# Patient Record
Sex: Female | Born: 1955 | Race: White | Hispanic: No | Smoking: Current every day smoker
Health system: Southern US, Community
[De-identification: ages and names within clinical notes are randomized; demographics above are authoritative.]

## PROBLEM LIST (undated history)

## (undated) DIAGNOSIS — I10 Essential (primary) hypertension: Secondary | ICD-10-CM

---

## 2003-11-25 ENCOUNTER — Emergency Department: Payer: Self-pay | Admitting: Emergency Medicine

## 2005-01-07 ENCOUNTER — Ambulatory Visit: Payer: Self-pay | Admitting: Obstetrics and Gynecology

## 2006-01-10 ENCOUNTER — Ambulatory Visit: Payer: Self-pay | Admitting: Obstetrics and Gynecology

## 2007-01-18 ENCOUNTER — Ambulatory Visit: Payer: Self-pay | Admitting: Obstetrics and Gynecology

## 2008-03-28 ENCOUNTER — Ambulatory Visit: Payer: Self-pay | Admitting: Obstetrics and Gynecology

## 2010-02-24 ENCOUNTER — Emergency Department: Payer: Self-pay | Admitting: Emergency Medicine

## 2010-02-24 ENCOUNTER — Ambulatory Visit: Payer: Self-pay | Admitting: Internal Medicine

## 2010-03-19 ENCOUNTER — Ambulatory Visit: Payer: Self-pay | Admitting: Family Medicine

## 2010-03-30 ENCOUNTER — Ambulatory Visit: Payer: Self-pay | Admitting: Family Medicine

## 2011-03-22 ENCOUNTER — Ambulatory Visit: Payer: Self-pay | Admitting: Family Medicine

## 2011-03-29 ENCOUNTER — Ambulatory Visit: Payer: Self-pay | Admitting: Family Medicine

## 2012-04-23 ENCOUNTER — Ambulatory Visit: Payer: Self-pay | Admitting: Emergency Medicine

## 2013-10-14 ENCOUNTER — Ambulatory Visit: Payer: Self-pay | Admitting: Physician Assistant

## 2014-12-25 ENCOUNTER — Emergency Department
Admission: EM | Admit: 2014-12-25 | Discharge: 2014-12-26 | Disposition: A | Discharge status: Home / Self Care | Payer: BLUE CROSS/BLUE SHIELD | Attending: Emergency Medicine | Admitting: Emergency Medicine

## 2014-12-25 DIAGNOSIS — Z79899 Other long term (current) drug therapy: Secondary | ICD-10-CM | POA: Insufficient documentation

## 2014-12-25 DIAGNOSIS — R000 Tachycardia, unspecified: Secondary | ICD-10-CM | POA: Insufficient documentation

## 2014-12-25 DIAGNOSIS — F17200 Nicotine dependence, unspecified, uncomplicated: Secondary | ICD-10-CM | POA: Insufficient documentation

## 2014-12-25 DIAGNOSIS — R0602 Shortness of breath: Secondary | ICD-10-CM | POA: Diagnosis not present

## 2014-12-25 DIAGNOSIS — Z880 Allergy status to penicillin: Secondary | ICD-10-CM | POA: Diagnosis not present

## 2014-12-25 DIAGNOSIS — R079 Chest pain, unspecified: Secondary | ICD-10-CM | POA: Diagnosis present

## 2014-12-25 DIAGNOSIS — I10 Essential (primary) hypertension: Secondary | ICD-10-CM | POA: Diagnosis not present

## 2014-12-25 DIAGNOSIS — R0789 Other chest pain: Secondary | ICD-10-CM | POA: Insufficient documentation

## 2014-12-25 HISTORY — DX: Essential (primary) hypertension: I10

## 2014-12-26 ENCOUNTER — Emergency Department: Payer: BLUE CROSS/BLUE SHIELD

## 2014-12-26 ENCOUNTER — Encounter: Payer: Self-pay | Admitting: Emergency Medicine

## 2014-12-26 LAB — HEPATIC FUNCTION PANEL
ALT: 14 U/L (ref 14–54)
AST: 23 U/L (ref 15–41)
Albumin: 3.8 g/dL (ref 3.5–5.0)
Alkaline Phosphatase: 89 U/L (ref 38–126)
BILIRUBIN DIRECT: 0.1 mg/dL (ref 0.1–0.5)
BILIRUBIN TOTAL: 0.6 mg/dL (ref 0.3–1.2)
Indirect Bilirubin: 0.5 mg/dL (ref 0.3–0.9)
Total Protein: 6.4 g/dL — ABNORMAL LOW (ref 6.5–8.1)

## 2014-12-26 LAB — BASIC METABOLIC PANEL
ANION GAP: 4 — AB (ref 5–15)
BUN: 11 mg/dL (ref 6–20)
CALCIUM: 9.2 mg/dL (ref 8.9–10.3)
CO2: 25 mmol/L (ref 22–32)
CREATININE: 0.62 mg/dL (ref 0.44–1.00)
Chloride: 110 mmol/L (ref 101–111)
GFR calc Af Amer: >60 mL/min (ref >60)
GFR calc non Af Amer: >60 mL/min (ref >60)
GLUCOSE: 116 mg/dL — AB (ref 65–99)
Potassium: 4.1 mmol/L (ref 3.5–5.1)
Sodium: 139 mmol/L (ref 135–145)

## 2014-12-26 LAB — CBC
HEMATOCRIT: 44.7 % (ref 35.0–47.0)
Hemoglobin: 14.8 g/dL (ref 12.0–16.0)
MCH: 30.4 pg (ref 26.0–34.0)
MCHC: 33.2 g/dL (ref 32.0–36.0)
MCV: 91.6 fL (ref 80.0–100.0)
Platelets: 254 10*3/uL (ref 150–440)
RBC: 4.88 MIL/uL (ref 3.80–5.20)
RDW: 13 % (ref 11.5–14.5)
WBC: 12.5 10*3/uL — ABNORMAL HIGH (ref 3.6–11.0)

## 2014-12-26 LAB — TROPONIN I

## 2014-12-26 LAB — MAGNESIUM: Magnesium: 2 mg/dL (ref 1.7–2.4)

## 2014-12-26 MED ORDER — ASPIRIN 81 MG PO CHEW
324.0000 mg | CHEWABLE_TABLET | Freq: Once | ORAL | Status: AC
  Administered 2014-12-26: 324 mg via ORAL
  Filled 2014-12-26: qty 4

## 2014-12-26 NOTE — ED Provider Notes (Signed)
[facility] Emergency Department Provider Note  ____________________________________________  Time seen: Approximately 12:28 AM  I have reviewed the triage vital signs and the nursing notes.   HISTORY  Chief Complaint Chest Pain    HPI Holly Cordova is a 59 y.o. female with a history of hypertension and tobacco use who presents with acute onset chest pain since yesterday.  Reportedly the chest pain started yesterday but increased today.  It is severe at worst but is currently gone.  It radiates down her left arm.  During the time that she was having the severe chest pain earlier this evening she also felt like she was going to pass out and her daughter, who is a Engineer, civil (consulting), put a pulse ox monitor on her and states that her oxygen saturation dropped from 93% down to 75% while she was having the chest pain and then back up to 93%.  She describes the chest pain as below her sternum, severe stabbing, and going "right through me".  When EMS arrived she was hypertensive with a systolic pressure in the 190s but she was normotensive for [place] in the emergency department.  She is currently pain-free and looks and acts comfortable.  She has had no nausea/vomiting, abdominal pain, dysuria, fever/chills.   Past Medical History  Diagnosis Date  . Hypertension     There are no active problems to display for this patient.   Past Surgical History  Procedure Laterality Date  . Cesarean section      Current Outpatient Rx  Name  Route  Sig  Dispense  Refill  . lisinopril (PRINIVIL,ZESTRIL) 10 MG tablet   Oral   Take 10 mg by mouth daily.      3     Allergies Morphine and related and Penicillins  No family history on file.  Social History Social History  Substance Use Topics  . Smoking status: Current Every Day Smoker  . Smokeless tobacco: None  . Alcohol Use: No    Review of Systems Constitutional: No fever/chills Eyes: No visual changes. ENT: No sore  throat. Cardiovascular: Chest pain over the last 24+ hours.  Regional palpitations Respiratory: Shortness of breath with the severe chest pain. Gastrointestinal: No abdominal pain.  No nausea, no vomiting.  No diarrhea.  No constipation. Genitourinary: Negative for dysuria. Musculoskeletal: Negative for back pain. Skin: Negative for rash. Neurological: Negative for headaches, focal weakness or numbness.  Did have an episode where she thought she might pass out. 10-point ROS otherwise negative.  ____________________________________________   PHYSICAL EXAM:  VITAL SIGNS: ED Triage Vitals  Enc Vitals Group     BP 12/26/14 0003 138/96 mmHg     Pulse Rate 12/26/14 0003 101     Resp --      Temp 12/26/14 0003 98.1 F (36.7 C)     Temp Source 12/26/14 0003 Oral     SpO2 12/26/14 0003 100 %     Weight 12/26/14 0003 200 lb (90.719 kg)     Height --      Head Cir --      Peak Flow --      Pain Score --      Pain Loc --      Pain Edu? --      Excl. in GC? --     Constitutional: Alert and oriented. Well appearing and in no acute distress. Eyes: Conjunctivae are normal. PERRL. EOMI. Head: Atraumatic. Nose: No congestion/rhinnorhea. Mouth/Throat: Mucous membranes are moist.  Oropharynx non-erythematous.  Neck: No stridor.   Cardiovascular: Borderline tachycardia, regular rhythm. Grossly normal heart sounds.  Good peripheral circulation. Respiratory: Normal respiratory effort.  No retractions. Lungs CTAB. Gastrointestinal: Soft and nontender. No distention. No abdominal bruits. No CVA tenderness. Musculoskeletal: No lower extremity tenderness nor edema.  No joint effusions.  Reproducible pain/tenderness with range of motion of the left arm/shoulder. Neurologic:  Normal speech and language. No gross focal neurologic deficits are appreciated.  Skin:  Skin is warm, dry and intact. No rash noted. Remove comments  ____________________________________________   LABS (all labs ordered  are listed, but only abnormal results are displayed)  Labs Reviewed  CBC - Abnormal; Notable for the following:    WBC 12.5 (*)    All other components within normal limits  BASIC METABOLIC PANEL - Abnormal; Notable for the following:    Glucose, Bld 116 (*)    Anion gap 4 (*)    All other components within normal limits  HEPATIC FUNCTION PANEL - Abnormal; Notable for the following:    Total Protein 6.4 (*)    All other components within normal limits  MAGNESIUM  TROPONIN I   ____________________________________________  EKG  ED ECG REPORT I, Derward Marple, the attending physician, personally viewed and interpreted this ECG.  Date: 12/26/2014 EKG Time: 00:08 Rate: 97 Rhythm: normal sinus rhythm QRS Axis: normal Intervals: normal ST/T Wave abnormalities: normal Conduction Disutrbances: none Narrative Interpretation: unremarkable  ____________________________________________  RADIOLOGY   Dg Chest 2 View  12/26/2014  CLINICAL DATA:  Acute onset of worsening generalized chest pain, running down the left arm. Tachycardia and hypertension. Initial encounter. EXAM: CHEST  2 VIEW COMPARISON:  None. FINDINGS: The lungs are well-aerated. Mild peribronchial thickening is noted. There is no evidence of focal opacification, pleural effusion or pneumothorax. The heart is normal in size; the mediastinal contour is within normal limits. No acute osseous abnormalities are seen. IMPRESSION: Mild peribronchial thickening noted.  Lungs otherwise clear. Electronically Signed   By: Roanna RaiderJeffery  Chang M.D.   On: 12/26/2014 00:34    ____________________________________________   PROCEDURES  Procedure(s) performed: None  Critical Care performed: No ____________________________________________   INITIAL IMPRESSION / ASSESSMENT AND PLAN / ED COURSE  Pertinent labs & imaging results that were available during my care of the patient were reviewed by me and considered in my medical decision  making (see chart for details).  The patient's HEART score is 3 (low risk, appropriate for immediate discharge).  Wells Score for PE is at most 1.5 if you take the tachycardia greater than 100 bpm, although this is not consistent and she is typically right around 100.  This also makes pulmonary embolism unlikely.  The patient was observed for multiple hours in the emergency department and had no recurrence of any of her symptoms.  I discussed the possibility that she may be suffering from panic attacks.  She is going through a difficult time due to an issue at work that is resulting in a [employer] court case for which she has to be a witness in several days.  Her workup is reassuring today and I discussed with her the need for outpatient follow-up.  She was given a full dose aspirin when she was in the emergency department.  She is asymptomatic and in good spirits at the time of our discussion and I gave her my usual and customary return precautions.  ____________________________________________  FINAL CLINICAL IMPRESSION(S) / ED DIAGNOSES  Final diagnoses:  Atypical chest pain      NEW  MEDICATIONS STARTED DURING THIS VISIT:  Discharge Medication List as of 12/26/2014  4:38 AM       Loleta Rose, MD 12/26/14 [phone]

## 2014-12-26 NOTE — ED Notes (Signed)
Report received care assumed.
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
## 2014-12-26 NOTE — Discharge Instructions (Signed)

## 2014-12-26 NOTE — ED Notes (Signed)
Lab called at 2:52 and notified RN that light green tube was not useable. Lab was called back and notified that there was a red top tube down there and to use it. They stated that they would look for it and use it.

## 2014-12-26 NOTE — ED Notes (Signed)
Discharge instructions reviewed.  Pt going home with daughter driving.  Verbalized understanding of discharge instruction.  No questions or concerns at this time.  Items with pt upon discharge.  No items left in ED.
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
## 2014-12-31 ENCOUNTER — Ambulatory Visit
Admission: EM | Admit: 2014-12-31 | Discharge: 2014-12-31 | Disposition: A | Discharge status: Home / Self Care | Payer: BLUE CROSS/BLUE SHIELD | Attending: Family Medicine | Admitting: Family Medicine

## 2014-12-31 ENCOUNTER — Encounter: Payer: Self-pay | Admitting: Emergency Medicine

## 2014-12-31 DIAGNOSIS — F419 Anxiety disorder, unspecified: Secondary | ICD-10-CM | POA: Insufficient documentation

## 2014-12-31 DIAGNOSIS — Z880 Allergy status to penicillin: Secondary | ICD-10-CM | POA: Diagnosis not present

## 2014-12-31 DIAGNOSIS — R000 Tachycardia, unspecified: Secondary | ICD-10-CM | POA: Diagnosis not present

## 2014-12-31 DIAGNOSIS — Z79899 Other long term (current) drug therapy: Secondary | ICD-10-CM | POA: Insufficient documentation

## 2014-12-31 DIAGNOSIS — F17200 Nicotine dependence, unspecified, uncomplicated: Secondary | ICD-10-CM | POA: Diagnosis not present

## 2014-12-31 DIAGNOSIS — I10 Essential (primary) hypertension: Secondary | ICD-10-CM | POA: Diagnosis not present

## 2014-12-31 LAB — URINALYSIS COMPLETE WITH MICROSCOPIC (ARMC ONLY)
BILIRUBIN URINE: NEGATIVE
Glucose, UA: NEGATIVE mg/dL
Hgb urine dipstick: NEGATIVE
KETONES UR: NEGATIVE mg/dL
Nitrite: NEGATIVE
Protein, ur: NEGATIVE mg/dL
RBC / HPF: NONE SEEN RBC/hpf (ref 0–5)
SPECIFIC GRAVITY, URINE: 1.01 (ref 1.005–1.030)
pH: 6 (ref 5.0–8.0)

## 2014-12-31 LAB — BASIC METABOLIC PANEL
Anion gap: 11 (ref 5–15)
BUN: 12 mg/dL (ref 6–20)
CO2: 26 mmol/L (ref 22–32)
CREATININE: 0.82 mg/dL (ref 0.44–1.00)
Calcium: 10.3 mg/dL (ref 8.9–10.3)
Chloride: 103 mmol/L (ref 101–111)
GFR calc Af Amer: >60 mL/min (ref >60)
GLUCOSE: 144 mg/dL — AB (ref 65–99)
POTASSIUM: 4 mmol/L (ref 3.5–5.1)
SODIUM: 140 mmol/L (ref 135–145)

## 2014-12-31 LAB — CBC WITH DIFFERENTIAL/PLATELET
BASOS ABS: 0.1 10*3/uL (ref 0–0.1)
Basophils Relative: 1 %
EOS ABS: 0.2 10*3/uL (ref 0–0.7)
EOS PCT: 2 %
HCT: 47 % (ref 35.0–47.0)
Hemoglobin: 15.9 g/dL (ref 12.0–16.0)
LYMPHS ABS: 3.9 10*3/uL — AB (ref 1.0–3.6)
LYMPHS PCT: 34 %
MCH: 30.9 pg (ref 26.0–34.0)
MCHC: 33.8 g/dL (ref 32.0–36.0)
MCV: 91.4 fL (ref 80.0–100.0)
MONO ABS: 0.5 10*3/uL (ref 0.2–0.9)
Monocytes Relative: 5 %
Neutro Abs: 6.6 10*3/uL — ABNORMAL HIGH (ref 1.4–6.5)
Neutrophils Relative %: 58 %
PLATELETS: 243 10*3/uL (ref 150–440)
RBC: 5.14 MIL/uL (ref 3.80–5.20)
RDW: 12.9 % (ref 11.5–14.5)
WBC: 11.3 10*3/uL — AB (ref 3.6–11.0)

## 2014-12-31 LAB — MAGNESIUM: Magnesium: 2.1 mg/dL (ref 1.7–2.4)

## 2014-12-31 NOTE — ED Provider Notes (Signed)
CSN: [id number]     Arrival date & time 12/31/14  1934 History   None    Chief Complaint  Patient presents with  . Tachycardia   (Consider location/radiation/quality/duration/timing/severity/associated sxs/prior Treatment) HPI Comments: 59 yo female with a c/o "feeling like heart is racing" and high blood pressure at home. Patient brought in by daughter. States has had similar symptoms in the past, last time being last week and was seen in ED with negative work up. Denies any situational anxiety, fevers, chills, chest pains, shortness of breath, fainting, syncopal episodes; leg swelling; however states felt slightly dizzy earlier.   The history is provided by the patient.    Past Medical History  Diagnosis Date  . Hypertension    Past Surgical History  Procedure Laterality Date  . Cesarean section     History reviewed. No pertinent family history. Social History  Substance Use Topics  . Smoking status: Current Every Day Smoker  . Smokeless tobacco: None  . Alcohol Use: No   OB History    No data available     Review of Systems  Allergies  Morphine and related and Penicillins  Home Medications   Prior to Admission medications   Medication Sig Start Date End Date Taking? Authorizing Provider  ALPRAZolam Prudy Feeler(XANAX) 0.25 MG tablet Take 0.25 mg by mouth at bedtime as needed for anxiety.   Yes Historical Provider, MD  lisinopril (PRINIVIL,ZESTRIL) 10 MG tablet Take 10 mg by mouth daily. 12/12/14  Yes Historical Provider, MD   Meds Ordered and Administered this Visit  Medications - No data to display  BP 142/67 mmHg  Pulse 103  Temp(Src) 98.3 F (36.8 C)  Resp 18  Ht 5\' 1"  (1.549 m)  Wt 199 lb (90.266 kg)  BMI 37.62 kg/m2  SpO2 100% No data found.   Physical Exam  Constitutional: She is oriented to person, place, and time. She appears well-developed and well-nourished. No distress.  HENT:  Head: Normocephalic.  Right Ear: Tympanic membrane, external ear and ear  canal normal.  Left Ear: Tympanic membrane, external ear and ear canal normal.  Nose: Nose normal.  Mouth/Throat: Oropharynx is clear and moist and mucous membranes are normal.  Eyes: Conjunctivae and EOM are normal. Pupils are equal, round, and reactive to light. Right eye exhibits no discharge. Left eye exhibits no discharge. No scleral icterus.  Neck: Normal range of motion. Neck supple. No JVD present. No tracheal deviation present. No thyromegaly present.  Cardiovascular: Regular rhythm, normal heart sounds and intact distal pulses.  Tachycardia present.   No murmur heard. Pulmonary/Chest: Effort normal and breath sounds normal. No stridor. No respiratory distress. She has no wheezes. She has no rales. She exhibits no tenderness.  Musculoskeletal: She exhibits no edema or tenderness.  Lymphadenopathy:    She has no cervical adenopathy.  Neurological: She is alert and oriented to person, place, and time.  Skin: No rash noted. She is not diaphoretic.  Nursing note and vitals reviewed.   ED Course  Procedures (including critical care time)  Labs Review Labs Reviewed  BASIC METABOLIC PANEL - Abnormal; Notable for the following:    Glucose, Bld 144 (*)    All other components within normal limits  CBC WITH DIFFERENTIAL/PLATELET - Abnormal; Notable for the following:    WBC 11.3 (*)    Neutro Abs 6.6 (*)    Lymphs Abs 3.9 (*)    All other components within normal limits  URINALYSIS COMPLETEWITH MICROSCOPIC ([facility] ONLY) - Abnormal; Notable for  the following:    Leukocytes, UA TRACE (*)    Bacteria, UA RARE (*)    Squamous Epithelial / LPF 0-5 (*)    All other components within normal limits  TSH - Abnormal; Notable for the following:    TSH 7.217 (*)    All other components within normal limits  MAGNESIUM    Imaging Review No results found.   Visual Acuity Review  Right Eye Distance:   Left Eye Distance:   Bilateral Distance:    Right Eye Near:   Left Eye Near:     Bilateral Near:      EKG: sinus tachycardia; reviewed by me; agree with readout  MDM   1. Tachycardia   2. Anxiety    Discharge Medication List as of 12/31/2014  9:17 PM    1. Labresults and diagnosis reviewed with patient/daughter 2.patient given ativan  po x1 with improvement of symptoms and signs; bp and pulse decreased 3. TSH pending 4. Follow-up with PCP this week for further evaluation, possible referral to cardiology for further assessment; or prn if symptoms worsen or don't improve    Payton Mccallum, MD 01/09/15 1117

## 2014-12-31 NOTE — ED Notes (Signed)
Patient states she feels like her heart is racing and her bp is elevated. Was seen in the ED last Wednesday evening for similar symptoms.

## 2015-01-01 LAB — TSH: TSH: 7.217 u[IU]/mL — ABNORMAL HIGH (ref 0.350–4.500)

## 2015-01-13 ENCOUNTER — Emergency Department
Admission: EM | Admit: 2015-01-13 | Discharge: 2015-01-14 | Disposition: A | Discharge status: Home / Self Care | Payer: BLUE CROSS/BLUE SHIELD | Attending: Emergency Medicine | Admitting: Emergency Medicine

## 2015-01-13 DIAGNOSIS — R002 Palpitations: Secondary | ICD-10-CM | POA: Diagnosis present

## 2015-01-13 DIAGNOSIS — R200 Anesthesia of skin: Secondary | ICD-10-CM | POA: Diagnosis not present

## 2015-01-13 DIAGNOSIS — Z79899 Other long term (current) drug therapy: Secondary | ICD-10-CM | POA: Diagnosis not present

## 2015-01-13 DIAGNOSIS — I10 Essential (primary) hypertension: Secondary | ICD-10-CM | POA: Diagnosis not present

## 2015-01-13 DIAGNOSIS — E039 Hypothyroidism, unspecified: Secondary | ICD-10-CM

## 2015-01-13 DIAGNOSIS — Z880 Allergy status to penicillin: Secondary | ICD-10-CM | POA: Diagnosis not present

## 2015-01-13 DIAGNOSIS — R202 Paresthesia of skin: Secondary | ICD-10-CM | POA: Diagnosis not present

## 2015-01-13 DIAGNOSIS — F17200 Nicotine dependence, unspecified, uncomplicated: Secondary | ICD-10-CM | POA: Diagnosis not present

## 2015-01-13 LAB — CBC
HEMATOCRIT: 47.8 % — AB (ref 35.0–47.0)
HEMOGLOBIN: 15.6 g/dL (ref 12.0–16.0)
MCH: 30.4 pg (ref 26.0–34.0)
MCHC: 32.7 g/dL (ref 32.0–36.0)
MCV: 92.8 fL (ref 80.0–100.0)
Platelets: 282 10*3/uL (ref 150–440)
RBC: 5.15 MIL/uL (ref 3.80–5.20)
RDW: 13.3 % (ref 11.5–14.5)
WBC: 13.7 10*3/uL — ABNORMAL HIGH (ref 3.6–11.0)

## 2015-01-13 LAB — BASIC METABOLIC PANEL
Anion gap: 8 (ref 5–15)
BUN: 15 mg/dL (ref 6–20)
CHLORIDE: 106 mmol/L (ref 101–111)
CO2: 26 mmol/L (ref 22–32)
CREATININE: 0.77 mg/dL (ref 0.44–1.00)
Calcium: 10.1 mg/dL (ref 8.9–10.3)
GFR calc non Af Amer: >60 mL/min (ref >60)
Glucose, Bld: 143 mg/dL — ABNORMAL HIGH (ref 65–99)
POTASSIUM: 3.6 mmol/L (ref 3.5–5.1)
Sodium: 140 mmol/L (ref 135–145)

## 2015-01-13 LAB — T4, FREE: FREE T4: 0.85 ng/dL (ref 0.61–1.12)

## 2015-01-13 LAB — TSH: TSH: 6.356 u[IU]/mL — AB (ref 0.350–4.500)

## 2015-01-14 LAB — TROPONIN I: Troponin I: <0.03 ng/mL (ref <0.031)

## 2015-01-14 NOTE — ED Provider Notes (Signed)
[facility] Emergency Department Provider Note  ____________________________________________  Time seen: 12:15 AM  I have reviewed the triage vital signs and the nursing notes.   HISTORY  Chief Complaint Numbness      HPI Holly Cordova is a 59 y.o. female presents with history of "heart racing" this evening following by bilateral hands and feet tingling and numbness and dizziness. Patient denies any chest pain no shortness of breath no nausea or vomiting. Patient states that she's had 2 previous episodes of the same.  Past Medical History  Diagnosis Date  . Hypertension     There are no active problems to display for this patient.   Past Surgical History  Procedure Laterality Date  . Cesarean section      Current Outpatient Rx  Name  Route  Sig  Dispense  Refill  . FLUoxetine (PROZAC) 20 MG capsule   Oral   Take 20 mg by mouth daily.         Marland Kitchen lisinopril (PRINIVIL,ZESTRIL) 10 MG tablet   Oral   Take 10 mg by mouth daily.      3   . LORazepam (ATIVAN) 0.5 MG tablet   Oral   Take 0.5 mg by mouth 2 (two) times daily as needed for anxiety.         . ALPRAZolam (XANAX) 0.25 MG tablet   Oral   Take 0.25 mg by mouth at bedtime as needed for anxiety.           Allergies Morphine and related and Penicillins  No family history on file.  Social History Social History  Substance Use Topics  . Smoking status: Current Every Day Smoker  . Smokeless tobacco: None  . Alcohol Use: No    Review of Systems  Constitutional: Negative for fever. Eyes: Negative for visual changes. ENT: Negative for sore throat. Cardiovascular: Negative for chest pain. Positive palpitations Respiratory: Negative for shortness of breath. Gastrointestinal: Negative for abdominal pain, vomiting and diarrhea. Genitourinary: Negative for dysuria. Musculoskeletal: Negative for back pain. Skin: Negative for rash. Neurological: Negative for headaches,  focal weakness or numbness.   10-point ROS otherwise negative.  ____________________________________________   PHYSICAL EXAM:  VITAL SIGNS: ED Triage Vitals  Enc Vitals Group     BP 01/13/15 2109 167/73 mmHg     Pulse Rate 01/13/15 2109 120     Resp 01/13/15 2109 18     Temp 01/13/15 2109 98.8 F (37.1 C)     Temp Source 01/13/15 2109 Oral     SpO2 01/13/15 2109 99 %     Weight 01/13/15 2109 195 lb (88.451 kg)     Height 01/13/15 2109  (1.549 m)     Head Cir --      Peak Flow --      Pain Score 01/13/15 2112 0     Pain Loc --      Pain Edu? --      Excl. in GC? --     Constitutional: Alert and oriented. Well appearing and in no distress. Eyes: Conjunctivae are normal. PERRL. Normal extraocular movements. ENT   Head: Normocephalic and atraumatic.   Nose: No congestion/rhinnorhea.   Mouth/Throat: Mucous membranes are moist.   Neck: No stridor. Hematological/Lymphatic/Immunilogical: No cervical lymphadenopathy. Cardiovascular: Normal rate, regular rhythm. Normal and symmetric distal pulses are present in all extremities. No murmurs, rubs, or gallops. Respiratory: Normal respiratory effort without tachypnea nor retractions. Breath sounds are clear and equal bilaterally. No wheezes/rales/rhonchi. Gastrointestinal:  Soft and nontender. No distention. There is no CVA tenderness. Genitourinary: deferred Musculoskeletal: Nontender with normal range of motion in all extremities. No joint effusions.  No lower extremity tenderness nor edema. Neurologic:  Normal speech and language. No gross focal neurologic deficits are appreciated. Speech is normal.  Skin:  Skin is warm, dry and intact. No rash noted. Psychiatric: Mood and affect are normal. Speech and behavior are normal. Patient exhibits appropriate insight and judgment.  ____________________________________________    LABS (pertinent positives/negatives)  Labs Reviewed  TSH - Abnormal; Notable for the  following:    TSH 6.356 (*)    All other components within normal limits  CBC - Abnormal; Notable for the following:    WBC 13.7 (*)    HCT 47.8 (*)    All other components within normal limits  BASIC METABOLIC PANEL - Abnormal; Notable for the following:    Glucose, Bld 143 (*)    All other components within normal limits  T4, FREE     ____________________________________________   EKG  ED ECG REPORT I, Carmelia Tiner, [place] N, the attending physician, personally viewed and interpreted this ECG.   Date: 01/14/2015  EKG Time: 9:31 PM  Rate: 105  Rhythm: Sinus tachycardia  Axis: None  Intervals: Normal  ST&T Change: None   ____________________________________________    RADIOLOGY     INITIAL IMPRESSION / ASSESSMENT AND PLAN / ED COURSE  Pertinent labs & imaging results that were available during my care of the patient were reviewed by me and considered in my medical decision making (see chart for details).  Patient very calm and does not appear to be anxious during emergency Department stay. Concern for possible arrhythmia as inciting event which may have provoked some anxiety. As such we'll refer the patient is Dr. Gwen PoundsKowalski for further evaluation and possible arrhythmia. I suspect the patient's tingling and numbness in her hands and feet was probably secondary to tachypnea with resultant hypocarbia.  ____________________________________________   FINAL CLINICAL IMPRESSION(S) / ED DIAGNOSES  Final diagnoses:  Heart palpitations  Hypothyroidism, unspecified hypothyroidism type      Darci Currentandolph N Audreena Sachdeva, MD 01/14/15 [phone]

## 2015-01-14 NOTE — ED Notes (Signed)
Patient with no complaints at this time. Respirations even and unlabored. Skin warm/dry. Discharge instructions reviewed with patient at this time. Patient given opportunity to voice concerns/ask questions. Patient discharged at this time and left Emergency Department with steady gait.   

## 2015-01-14 NOTE — ED Notes (Signed)
RN called lab to report the added troponin.

## 2015-01-14 NOTE — ED Notes (Signed)
Pt seen 2 weeks ago in ED for elevated HR and HTN. Pt dx with anxiety and given ativan and prozac. Pt reports no improvements in symptoms since beginning medication. Pt reports sitting on couch tonight and a "wave" came over her and her heart begins racing she becomes dizzy and tingling in both hands and feet. Pt denies SOB during attack but does report becoming flushed and feeling as though she would pass out.

## 2015-01-14 NOTE — ED Notes (Signed)
Lab called in regards to troponin. Joe reports the test should be done by now but then transfers RN to another lab member who states the test is an add on and "I will grab the tube and run it now."

## 2015-01-14 NOTE — ED Notes (Signed)
MD at bedside. 

## 2015-01-14 NOTE — Discharge Instructions (Signed)

## 2015-02-18 ENCOUNTER — Ambulatory Visit
Admission: EM | Admit: 2015-02-18 | Discharge: 2015-02-18 | Disposition: A | Discharge status: Home / Self Care | Payer: BLUE CROSS/BLUE SHIELD | Attending: Family Medicine | Admitting: Family Medicine

## 2015-02-18 DIAGNOSIS — G44209 Tension-type headache, unspecified, not intractable: Secondary | ICD-10-CM | POA: Diagnosis not present

## 2015-02-18 MED ORDER — KETOROLAC TROMETHAMINE 60 MG/2ML IM SOLN
60.0000 mg | Freq: Once | INTRAMUSCULAR | Status: AC
  Administered 2015-02-18: 60 mg via INTRAMUSCULAR

## 2015-02-18 MED ORDER — NAPROXEN 375 MG PO TABS
375.0000 mg | ORAL_TABLET | Freq: Two times a day (BID) | ORAL | Status: DC
Start: 2015-02-18 — End: 2018-02-22

## 2015-02-18 NOTE — ED Provider Notes (Signed)
CSN: [id number]     Arrival date & time 02/18/15  1846 History   First MD Initiated Contact with Patient 02/18/15 1944     Chief Complaint  Patient presents with  . Headache   (Consider location/radiation/quality/duration/timing/severity/associated sxs/prior Treatment) HPI  This a 60 year old female who presents with headache that she awoke with his had all day. She's tried ibuprofen without relief. He has had some nausea vomiting and a red hot face which she usually encounters when her blood pressure is elevated. She's had no visual disturbances no vomiting no confusion no motor weakness no numbness or tingling. She felt that her blood pressure was elevated so she took an extra half a tab in the afternoon which is unusual. Presentation she has a febrile she has a BP 140/58 , respirations 16 blood pressure 140/58 and O2 sats of 100% on room air. Pulse was not recorded but will be taken. She states that this is not the worst headache she has ever had and just a nagging headache. She's had frequent headaches in the past. Recently undergone a seat MRI upper head and neck last month which was negative. She had extensive workup the cause of palpitations nothing was definitively found and they relayed that it was most likely due to anxiety.  Past Medical History  Diagnosis Date  . Hypertension    Past Surgical History  Procedure Laterality Date  . Cesarean section     No family history on file. Social History  Substance Use Topics  . Smoking status: Current Every Day Smoker -- 0.50 packs/day  . Smokeless tobacco: None  . Alcohol Use: No   OB History    No data available     Review of Systems  Constitutional: Negative for fever, chills, diaphoresis and fatigue.  HENT: Negative for congestion and ear pain.   Neurological: Positive for headaches. Negative for dizziness, tremors, facial asymmetry, speech difficulty, weakness, light-headedness and numbness.  Psychiatric/Behavioral: Negative  for confusion and agitation.  All other systems reviewed and are negative.   Allergies  Morphine and related and Penicillins  Home Medications   Prior to Admission medications   Medication Sig Start Date End Date Taking? Authorizing Provider  FLUoxetine (PROZAC) 20 MG capsule Take 20 mg by mouth daily.   Yes Historical Provider, MD  hydrOXYzine (ATARAX/VISTARIL) 25 MG tablet Take 25 mg by mouth 3 (three) times daily as needed.   Yes Historical Provider, MD  lisinopril (PRINIVIL,ZESTRIL) 10 MG tablet Take 10 mg by mouth daily. 12/12/14  Yes Historical Provider, MD  LORazepam (ATIVAN) 0.5 MG tablet Take 0.5 mg by mouth 2 (two) times daily as needed for anxiety.   Yes Historical Provider, MD  ALPRAZolam Prudy Feeler(XANAX) 0.25 MG tablet Take 0.25 mg by mouth at bedtime as needed for anxiety.    Historical Provider, MD  naproxen (NAPROSYN) 375 MG tablet Take 1 tablet (375 mg total) by mouth 2 (two) times daily. 02/18/15   Lutricia FeilWilliam P Kiegan Macaraeg, PA-C   Meds Ordered and Administered this Visit   Medications  ketorolac (TORADOL) injection 60 mg (60 mg Intramuscular Given 02/18/15 2000)    BP 148/58 mmHg  Pulse 77  Temp(Src) 98 F (36.7 C) (Oral)  Resp 16  Ht 5\' 1"  (1.549 m)  Wt 192 lb (87.091 kg)  BMI 36.30 kg/m2  SpO2 100% No data found.   Physical Exam  Constitutional: She is oriented to person, place, and time. She appears well-developed and well-nourished. No distress.  HENT:  Head: Normocephalic and atraumatic.  Right Ear: External ear normal.  Left Ear: External ear normal.  Nose: Nose normal.  Mouth/Throat: Oropharynx is clear and moist.  Eyes: Conjunctivae and EOM are normal. Pupils are equal, round, and reactive to light. Right eye exhibits no discharge. Left eye exhibits no discharge.  Neck: Normal range of motion. Neck supple.  Pulmonary/Chest: Breath sounds normal. No respiratory distress. She has no wheezes. She has no rales.  Musculoskeletal: Normal range of motion. She exhibits no  edema or tenderness.  Neurological: She is alert and oriented to person, place, and time. She displays normal reflexes. No cranial nerve deficit. She exhibits normal muscle tone. Coordination normal.  Skin: Skin is warm and dry. She is not diaphoretic.  Psychiatric: She has a normal mood and affect. Her behavior is normal. Judgment and thought content normal.  Nursing note and vitals reviewed.   ED Course  Procedures (including critical care time)  Labs Review Labs Reviewed - No data to display  Imaging Review No results found.   Visual Acuity Review  Right Eye Distance:   Left Eye Distance:   Bilateral Distance:    Right Eye Near:   Left Eye Near:    Bilateral Near:     Medications  ketorolac (TORADOL) injection 60 mg (60 mg Intramuscular Given 02/18/15 2000)      MDM   1. Tension-type headache, not intractable, unspecified chronicity pattern    Discharge Medication List as of 02/18/2015  8:43 PM    START taking these medications   Details  naproxen (NAPROSYN) 375 MG tablet Take 1 tablet (375 mg total) by mouth 2 (two) times daily., Starting 02/18/2015, Until Discontinued, Print      Plan: 1. Diagnosis reviewed with patient 2. rx as per orders; risks, benefits, potential side effects reviewed with patient 3. Recommend supportive treatment with rest and hydration. She can take her antianxiety medications as necessary. I've given her a prescription for Naprosyn that she can take when she has severe headaches and not to mix it with her regular ibuprofen. Reassured her that her blood pressure is not excessively elevated today. She needs to follow-up with her primary care physician. Her pulse rate was taken by the nurse prior to the patient's departure. Is recorded at 77 bpm. At the time of her discharge her pain did decrease to a 3 with a marked improvement after the Toradol injection 4. F/u prn if symptoms worsen or don't improve     Lutricia Feil, PA-C 02/18/15  2112

## 2015-02-18 NOTE — Discharge Instructions (Signed)
Tension Headache A tension headache is a feeling of pain, pressure, or aching that is often felt over the front and sides of the head. The pain can be dull, or it can feel tight (constricting). Tension headaches are not normally associated with nausea or vomiting, and they do not get worse with physical activity. Tension headaches can last from 30 minutes to several days. This is the most common type of headache. CAUSES The exact cause of this condition is not known. Tension headaches often begin after stress, anxiety, or depression. Other triggers may include:  Alcohol.  Too much caffeine, or caffeine withdrawal.  Respiratory infections, such as colds, flu, or sinus infections.  Dental problems or teeth clenching.  Fatigue.  Holding your head and neck in the same position for a long period of time, such as while using a computer.  Smoking. SYMPTOMS Symptoms of this condition include:  A feeling of pressure around the head.  Dull, aching head pain.  Pain felt over the front and sides of the head.  Tenderness in the muscles of the head, neck, and shoulders. DIAGNOSIS This condition may be diagnosed based on your symptoms and a physical exam. Tests may be done, such as a CT scan or an MRI of your head. These tests may be done if your symptoms are severe or unusual. TREATMENT This condition may be treated with lifestyle changes and medicines to help relieve symptoms. HOME CARE INSTRUCTIONS Managing Pain  Take over-the-counter and prescription medicines only as told by your health care provider.  Lie down in a dark, quiet room when you have a headache.  If directed, apply ice to the head and neck area:  Put ice in a plastic bag.  Place a towel between your skin and the bag.  Leave the ice on for 20 minutes, 2-3 times per day.  Use a heating pad or a hot shower to apply heat to the head and neck area as told by your health care provider. Eating and Drinking  Eat meals on  a regular schedule.  Limit alcohol use.  Decrease your caffeine intake, or stop using caffeine. General Instructions  Keep all follow-up visits as told by your health care provider. This is important.  Keep a headache journal to help find out what may trigger your headaches. For example, write down:  What you eat and drink.  How much sleep you get.  Any change to your diet or medicines.  Try massage or other relaxation techniques.  Limit stress.  Sit up straight, and avoid tensing your muscles.  Do not use tobacco products, including cigarettes, chewing tobacco, or e-cigarettes. If you need help quitting, ask your health care provider.  Exercise regularly as told by your health care provider.  Get 7-9 hours of sleep, or the amount recommended by your health care provider. SEEK MEDICAL CARE IF:  Your symptoms are not helped by medicine.  You have a headache that is different from what you normally experience.  You have nausea or you vomit.  You have a fever. SEEK IMMEDIATE MEDICAL CARE IF:  Your headache becomes severe.  You have repeated vomiting.  You have a stiff neck.  You have a loss of vision.  You have problems with speech.  You have pain in your eye or ear.  You have muscular weakness or loss of muscle control.  You lose your balance or you have trouble walking.  You feel faint or you pass out.  You have confusion.     This information is not intended to replace advice given to you by your health care provider. Make sure you discuss any questions you have with your health care provider.   Document Released: 01/25/2005 Document Revised: 10/16/2014 Document Reviewed: 05/20/2014 Elsevier Interactive Patient Education 2016 [employer]  

## 2015-02-18 NOTE — ED Notes (Signed)
Pt c/o H/A "all day". Pt has tried ibuprofen, without relief. Associated sx include nausea, "red face". Denies "floaters", blurred vision, confusion, vomiting. Pt took Lisinopril 10 mg at 0900, and 1/2 tab around 1300.

## 2015-12-18 ENCOUNTER — Ambulatory Visit
Admission: EM | Admit: 2015-12-18 | Discharge: 2015-12-18 | Disposition: A | Discharge status: Home / Self Care | Payer: BLUE CROSS/BLUE SHIELD | Attending: Emergency Medicine | Admitting: Emergency Medicine

## 2015-12-18 ENCOUNTER — Encounter: Payer: Self-pay | Admitting: *Deleted

## 2015-12-18 DIAGNOSIS — R300 Dysuria: Secondary | ICD-10-CM | POA: Diagnosis not present

## 2015-12-18 DIAGNOSIS — R109 Unspecified abdominal pain: Secondary | ICD-10-CM | POA: Diagnosis not present

## 2015-12-18 LAB — BASIC METABOLIC PANEL
ANION GAP: 8 (ref 5–15)
BUN: 18 mg/dL (ref 6–20)
CALCIUM: 9.1 mg/dL (ref 8.9–10.3)
CO2: 22 mmol/L (ref 22–32)
Chloride: 106 mmol/L (ref 101–111)
Creatinine, Ser: 0.96 mg/dL (ref 0.44–1.00)
GFR calc Af Amer: >60 mL/min (ref >60)
GLUCOSE: 120 mg/dL — AB (ref 65–99)
Potassium: 3.8 mmol/L (ref 3.5–5.1)
Sodium: 136 mmol/L (ref 135–145)

## 2015-12-18 LAB — URINALYSIS COMPLETE WITH MICROSCOPIC (ARMC ONLY)
GLUCOSE, UA: NEGATIVE mg/dL
Leukocytes, UA: NEGATIVE
NITRITE: NEGATIVE
Protein, ur: 30 mg/dL — AB
pH: 5 (ref 5.0–8.0)

## 2015-12-18 LAB — CBC WITH DIFFERENTIAL/PLATELET
Basophils Absolute: 0 10*3/uL (ref 0–0.1)
Basophils Relative: 0 %
EOS PCT: 3 %
Eosinophils Absolute: 0.3 10*3/uL (ref 0–0.7)
HEMATOCRIT: 41.6 % (ref 35.0–47.0)
Hemoglobin: 14 g/dL (ref 12.0–16.0)
LYMPHS ABS: 4.3 10*3/uL — AB (ref 1.0–3.6)
LYMPHS PCT: 48 %
MCH: 28.5 pg (ref 26.0–34.0)
MCHC: 33.6 g/dL (ref 32.0–36.0)
MCV: 84.9 fL (ref 80.0–100.0)
MONO ABS: 0.7 10*3/uL (ref 0.2–0.9)
MONOS PCT: 8 %
NEUTROS ABS: 3.7 10*3/uL (ref 1.4–6.5)
Neutrophils Relative %: 41 %
PLATELETS: 243 10*3/uL (ref 150–440)
RBC: 4.9 MIL/uL (ref 3.80–5.20)
RDW: 15.2 % — AB (ref 11.5–14.5)
WBC: 9 10*3/uL (ref 3.6–11.0)

## 2015-12-18 MED ORDER — CEPHALEXIN 500 MG PO CAPS: 500.0000 mg | ORAL_CAPSULE | Freq: Three times a day (TID) | ORAL | 0 refills | Status: AC

## 2015-12-18 NOTE — ED Triage Notes (Signed)
Onset of right flank pain, and urinary urgency today. Hx of UTIs. Denies other symptoms.

## 2015-12-18 NOTE — Discharge Instructions (Signed)
Take medication as prescribed. Rest. Drink plenty of fluids.  ° °Follow up with your primary care physician this week. Return to Urgent care for new or worsening concerns.  ° °

## 2015-12-18 NOTE — ED Provider Notes (Signed)
[facility] ____________________________________________  Time seen: Approximately 6:16 PM  I have reviewed the triage vital signs and the nursing notes.   HISTORY  Chief Complaint Flank Pain and Urinary Urgency  HPI Holly BonineDonna West Balles is a 60 y.o. female presenting for the complaints of 1 day history of urinary frequency, urinary urgency and right flank pain. Patient describes right flank pain as aching and sometimes sharp. Patient reports movement and twisting makes right flank pain worse. Denies any abdominal pain. Patient reports that she has had similar presentation with urinary tract infection. Patient reports the one urinary tract infection that she had previously and that have the same presentation [facility] was [facility] found that she had strep in her urine and then had to be changed from Cipro to cephalexin and tolerated well. Patient reports right flank pain at this time is mild to moderate. Patient states that [facility] performed, no report or history of kidney stones for patient.  Denies fevers. Denies nausea, vomiting, diarrhea, constipation, chest pain, shortness of breath. Denies known trigger for her current symptoms. Denies recent sickness or recent antibiotic use. Patient states she has not had a urinary tract infection a [facility]. Denies any recent medication changes.   Past Medical History:  Diagnosis Date  . Hypertension     There are no active problems to display for this patient.   Past Surgical History:  Procedure Laterality Date  . CESAREAN SECTION      Current Outpatient Rx  . Order #: [id number] Class: Historical Med  . Order #: [id number] Class: Historical Med  . Order #: [id number] Class: Historical Med  . Order #: [id number] Class: Normal  . Order #: [id number] Class: Historical Med  . Order #: [id number] Class: Historical Med  . Order #: [id number] Class: Print    No current facility-administered medications  for this encounter.   Current Outpatient Prescriptions:  .  FLUoxetine (PROZAC) 20 MG capsule, Take 20 mg by mouth daily., Disp: , Rfl:  .  LORazepam (ATIVAN) 0.5 MG tablet, Take 0.5 mg by mouth 2 (two) times daily as needed for anxiety., Disp: , Rfl:  .  ALPRAZolam (XANAX) 0.25 MG tablet, Take 0.25 mg by mouth at bedtime as needed for anxiety., Disp: , Rfl:  .  cephALEXin (KEFLEX) 500 MG capsule, Take 1 capsule (500 mg total) by mouth 3 (three) times daily., Disp: 21 capsule, Rfl: 0 .  hydrOXYzine (ATARAX/VISTARIL) 25 MG tablet, Take 25 mg by mouth 3 (three) times daily as needed., Disp: , Rfl:  .  lisinopril (PRINIVIL,ZESTRIL) 10 MG tablet, Take 10 mg by mouth daily., Disp: , Rfl: 3 .  naproxen (NAPROSYN) 375 MG tablet, Take 1 tablet (375 mg total) by mouth 2 (two) times daily., Disp: 20 tablet, Rfl: 0  Allergies Morphine and related and Penicillins  History reviewed. No pertinent family history. Father: Kidney stones  Social History Social History  Substance Use Topics  . Smoking status: Current Every Day Smoker    Packs/day: 0.50  . Smokeless tobacco: Never Used  . Alcohol use No    Review of Systems Constitutional: No fever/chills Eyes: No visual changes. ENT: No sore throat. Cardiovascular: Denies chest pain. Respiratory: Denies shortness of breath. Gastrointestinal: No abdominal pain.  No nausea, no vomiting.  No diarrhea.  No constipation. Genitourinary: Positive for dysuria. Musculoskeletal: Negative for back pain. Skin: Negative for rash. Neurological: Negative for headaches, focal weakness or numbness.  10-point ROS otherwise negative.  ____________________________________________  PHYSICAL EXAM:  VITAL SIGNS: ED Triage Vitals  Enc Vitals Group     BP 12/18/15 1755 (!) 147/71     Pulse Rate 12/18/15 1755 71     Resp 12/18/15 1755 16     Temp 12/18/15 1755 98.1 F (36.7 C)     Temp Source 12/18/15 1755 Oral     SpO2 12/18/15 1755 97 %     Weight  12/18/15 1758 190 lb (86.2 kg)     Height 12/18/15 1758 5\' 1"  (1.549 m)     Head Circumference --      Peak Flow --      Pain Score 12/18/15 1802 8     Pain Loc --      Pain Edu? --      Excl. in GC? --     Constitutional: Alert and oriented. Well appearing and in no acute distress. Eyes: Conjunctivae are normal. PERRL. EOMI. ENT      Head: Normocephalic and atraumatic.      Mouth/Throat: Mucous membranes are moist.Oropharynx non-erythematous. Neck: No stridor. Supple without meningismus.  Cardiovascular: Normal rate, regular rhythm. Grossly normal heart sounds.  Good peripheral circulation. Respiratory: Normal respiratory effort without tachypnea nor retractions. Breath sounds are clear and equal bilaterally. No wheezes/rales/rhonchi.. Gastrointestinal: Soft and nontender.Obese abdomen. Normal Bowel sounds. No left CVA tenderness. Mild right CVA tenderness present. Musculoskeletal: Ambulatory with steady gait. No midline cervical, thoracic or lumbar tenderness to palpation.  Neurologic:  Normal speech and language. No gross focal neurologic deficits are appreciated. Speech is normal. No gait instability.  Skin:  Skin is warm, dry and intact. No rash noted. Psychiatric: Mood and affect are normal. Speech and behavior are normal. Patient exhibits appropriate insight and judgment   ___________________________________________   LABS (all labs ordered are listed, but only abnormal results are displayed)  Labs Reviewed  URINALYSIS COMPLETEWITH MICROSCOPIC ([facility] ONLY) - Abnormal; Notable for the following:       Result Value   Bilirubin Urine SMALL (*)    Ketones, ur TRACE (*)    Specific Gravity, Urine >1.030 (*)    Hgb urine dipstick TRACE (*)    Protein, ur 30 (*)    Bacteria, UA RARE (*)    Squamous Epithelial / LPF 6-30 (*)    All other components within normal limits  CBC WITH DIFFERENTIAL/PLATELET - Abnormal; Notable for the following:    RDW 15.2 (*)    Lymphs Abs 4.3 (*)     All other components within normal limits  BASIC METABOLIC PANEL - Abnormal; Notable for the following:    Glucose, Bld 120 (*)    All other components within normal limits  URINE CULTURE    RADIOLOGY  No results found. ____________________________________________   PROCEDURES Procedures    INITIAL IMPRESSION / ASSESSMENT AND PLAN / ED COURSE  Pertinent labs & imaging results that were available during my care of the patient were reviewed by me and considered in my medical decision making (see chart for details).  Very well-appearing patient. No acute distress. Daughter at bedside. Presenting for the complaints of dysuria and right flank pain. Denies history of stones. Patient reports similar presentation in past with UTI. Denies fevers. Denies being hospitalized for UTI. Denies any triggers. Reports continues to eat and drink well. Will evaluate urinalysis, CBC and BMP.  Urinalysis and laboratory results reviewed. Discussed in detail with patient laboratory results. Urinalysis with rare bacteria, trace hemoglobin, protein, trace ketones and 6-30 squamous epithelial cells. Discussed with  patient possible contaminated urine, will culture. However with patient presentation and history concern dysuria and flank pain related to urinary tract infection. Will treat patient with oral cephalexin. Patient declined IM Toradol in urgent care. Encourage rest, fluids and PCP follow up in the next week. Discussed strict follow-up and return parameters including worsening concerns proceeding directly to emergency room.  Discussed follow up with Primary care physician this week. Discussed follow up and return parameters including no resolution or any worsening concerns. Patient verbalized understanding and agreed to plan.   ____________________________________________   FINAL CLINICAL IMPRESSION(S) / ED DIAGNOSES  Final diagnoses:  Dysuria  Right flank pain     Discharge Medication List as  of 12/18/2015  7:04 PM    START taking these medications   Details  cephALEXin (KEFLEX) 500 MG capsule Take 1 capsule (500 mg total) by mouth 3 (three) times daily., Starting Thu 12/18/2015, Until Thu 12/25/2015, Normal        Note: This dictation was prepared with Dragon dictation along with smaller phrase technology. Any transcriptional errors that result from this process are unintentional.    Clinical Course       Renford DillsLindsey Sherrica Niehaus, NP 12/18/15 2009

## 2015-12-20 LAB — URINE CULTURE

## 2015-12-22 ENCOUNTER — Telehealth: Payer: Self-pay | Admitting: *Deleted

## 2015-12-22 NOTE — Telephone Encounter (Signed)
Called patient, verified DOB, reported inconclusive urine culture result. Patient reported feeling better since taking prescribed antibiotic. Advised patient to follow up with PCP or [facility] is symptoms return.

## 2015-12-24 ENCOUNTER — Ambulatory Visit (INDEPENDENT_AMBULATORY_CARE_PROVIDER_SITE_OTHER): Payer: BLUE CROSS/BLUE SHIELD

## 2015-12-24 ENCOUNTER — Ambulatory Visit
Admission: EM | Admit: 2015-12-24 | Discharge: 2015-12-24 | Disposition: A | Discharge status: Home / Self Care | Payer: BLUE CROSS/BLUE SHIELD | Attending: Family Medicine | Admitting: Family Medicine

## 2015-12-24 DIAGNOSIS — S61214A Laceration without foreign body of right ring finger without damage to nail, initial encounter: Secondary | ICD-10-CM | POA: Diagnosis not present

## 2015-12-24 DIAGNOSIS — S61213A Laceration without foreign body of left middle finger without damage to nail, initial encounter: Secondary | ICD-10-CM | POA: Diagnosis not present

## 2015-12-24 DIAGNOSIS — W540XXA Bitten by dog, initial encounter: Secondary | ICD-10-CM

## 2015-12-24 MED ORDER — CLINDAMYCIN HCL 300 MG PO CAPS: 300.0000 mg | ORAL_CAPSULE | Freq: Four times a day (QID) | ORAL | 0 refills | Status: DC

## 2015-12-24 MED ORDER — LEVOFLOXACIN 500 MG PO TABS: 500.0000 mg | ORAL_TABLET | Freq: Every day | ORAL | 0 refills | Status: DC

## 2015-12-24 NOTE — ED Provider Notes (Signed)
[facility]    CSN: [id number] Arrival date & time: 12/24/15  1747     History   Chief Complaint Chief Complaint  Patient presents with  . Animal Bite    Right Middle Finger    HPI Holly Cordova is a 60 y.o. female.   60 yo female with a c/o dog bite tonight. States she was picking up her grandson because her 2 dogs were growling at each other and one of her dogs bit her on her right middle finger causing a laceration. States her dogs are both immunized and up to date on their immunizations. Patient states that she's up to date on her tetanus (had Tdap one year ago).       The history is provided by the patient.  Animal Bite  Contact animal:  Dog Location:  Finger Finger injury location:  R long finger Time since incident:  1 hour Incident location:  Home Provoked: provoked   Notifications:  Animal control Animal's rabies vaccination status:  Up to date Animal in possession: no   Tetanus status:  Up to date   Past Medical History:  Diagnosis Date  . Hypertension     There are no active problems to display for this patient.   Past Surgical History:  Procedure Laterality Date  . CESAREAN SECTION      OB History    No data available       Home Medications    Prior to Admission medications   Medication Sig Start Date End Date Taking? Authorizing Provider  ALPRAZolam Prudy Feeler(XANAX) 0.25 MG tablet Take 0.25 mg by mouth at bedtime as needed for anxiety.   Yes Historical Provider, MD  cephALEXin (KEFLEX) 500 MG capsule Take 1 capsule (500 mg total) by mouth 3 (three) times daily. 12/18/15 12/25/15 Yes Renford DillsLindsey Miller, NP  FLUoxetine (PROZAC) 20 MG capsule Take 20 mg by mouth daily.   Yes Historical Provider, MD  hydrOXYzine (ATARAX/VISTARIL) 25 MG tablet Take 25 mg by mouth 3 (three) times daily as needed.   Yes Historical Provider, MD  lisinopril (PRINIVIL,ZESTRIL) 10 MG tablet Take 10 mg by mouth daily. 12/12/14  Yes Historical Provider, MD    LORazepam (ATIVAN) 0.5 MG tablet Take 0.5 mg by mouth 2 (two) times daily as needed for anxiety.   Yes Historical Provider, MD  naproxen (NAPROSYN) 375 MG tablet Take 1 tablet (375 mg total) by mouth 2 (two) times daily. 02/18/15  Yes Lutricia FeilWilliam P Roemer, PA-C  clindamycin (CLEOCIN) 300 MG capsule Take 1 capsule (300 mg total) by mouth 4 (four) times daily. 12/24/15   Payton Mccallumrlando Sharri Loya, MD  levofloxacin (LEVAQUIN) 500 MG tablet Take 1 tablet (500 mg total) by mouth daily. 12/24/15   Payton Mccallumrlando Tyna Huertas, MD    Family History History reviewed. No pertinent family history.  Social History Social History  Substance Use Topics  . Smoking status: Current Every Day Smoker    Packs/day: 0.50  . Smokeless tobacco: Never Used  . Alcohol use No     Allergies   Morphine and related and Penicillins   Review of Systems Review of Systems   Physical Exam Triage Vital Signs ED Triage Vitals  Enc Vitals Group     BP 12/24/15 1826 (!) 150/76     Pulse Rate 12/24/15 1826 77     Resp 12/24/15 1826 18     Temp 12/24/15 1826 97.5 F (36.4 C)     Temp Source 12/24/15 1826 Oral     SpO2  12/24/15 1826 97 %     Weight 12/24/15 1827 190 lb (86.2 kg)     Height 12/24/15 1827 5\' 1"  (1.549 m)     Head Circumference --      Peak Flow --      Pain Score 12/24/15 1828 5     Pain Loc --      Pain Edu? --      Excl. in GC? --    No data found.   Updated Vital Signs BP (!) 150/76 (BP Location: Left Arm)   Pulse 77   Temp 97.5 F (36.4 C) (Oral)   Resp 18   Ht 5\' 1"  (1.549 m)   Wt 190 lb (86.2 kg)   SpO2 97%   BMI 35.90 kg/m   Visual Acuity Right Eye Distance:   Left Eye Distance:   Bilateral Distance:    Right Eye Near:   Left Eye Near:    Bilateral Near:     Physical Exam  Constitutional: She appears well-developed and well-nourished. No distress.  Musculoskeletal:       Right hand: She exhibits tenderness, laceration (1.5 cm V-shaped laceration to proximal middle finger between the mcp and  pip joints; no tendon involvement; normal range of motion and strength of finger) and swelling (mild). She exhibits normal range of motion, no bony tenderness, normal two-point discrimination, normal capillary refill and no deformity. Normal sensation noted. Normal strength noted.  Skin: She is not diaphoretic.  Nursing note and vitals reviewed.    UC Treatments / Results  Labs (all labs ordered are listed, but only abnormal results are displayed) Labs Reviewed - No data to display  EKG  EKG Interpretation None       Radiology Dg Finger Middle Right  Result Date: 12/24/2015 CLINICAL DATA:  Dog bite with laceration. EXAM: RIGHT MIDDLE FINGER 2+V COMPARISON:  None. FINDINGS: No radiopaque foreign body. No fracture or dislocation. Site of laceration about the proximal level finger is not well visualized radiographically. IMPRESSION: Negative for radiopaque foreign body or acute osseous abnormality. Electronically Signed   By: Rubye OaksMelanie  Ehinger M.D.   On: 12/24/2015 19:11    Procedures Procedures (including critical care time)  Medications Ordered in UC Medications - No data to display   Initial Impression / Assessment and Plan / UC Course  I have reviewed the triage vital signs and the nursing notes.  Pertinent labs & imaging results that were available during my care of the patient were reviewed by me and considered in my medical decision making (see chart for details).  Clinical Course       Final Clinical Impressions(s) / UC Diagnoses   Final diagnoses:  Dog bite, initial encounter  Laceration of left middle finger without foreign body without damage to nail, initial encounter    New Prescriptions Discharge Medication List as of 12/24/2015  7:38 PM    START taking these medications   Details  clindamycin (CLEOCIN) 300 MG capsule Take 1 capsule (300 mg total) by mouth 4 (four) times daily., Starting Wed 12/24/2015, Normal    levofloxacin (LEVAQUIN) 500 MG tablet  Take 1 tablet (500 mg total) by mouth daily., Starting Wed 12/24/2015, Normal       1. x-ray result (negative) and diagnosis reviewed with patient 2. rx as per orders above; reviewed possible side effects, interactions, risks and benefits  3. Recommend supportive treatment with wound care and monitoring  4. Follow-up prn if symptoms worsen or don't improve   [address] Melba Araki,  MD 12/24/15 2051

## 2015-12-24 NOTE — ED Triage Notes (Addendum)
Pt was bitten by her dog at home tonight, the dogs were fighting each other and she bent down to separate them and one of them bit her. It happened around 4:55pm.

## 2017-02-27 IMAGING — CR DG CHEST 2V
2 series · 2 of 2 positions shown · non-contrast
Comparison: None.

CLINICAL DATA: Acute onset of worsening generalized chest pain,
running down the left arm. Tachycardia and hypertension. Initial
encounter.

EXAM:
CHEST  2 VIEW

[chest pa]
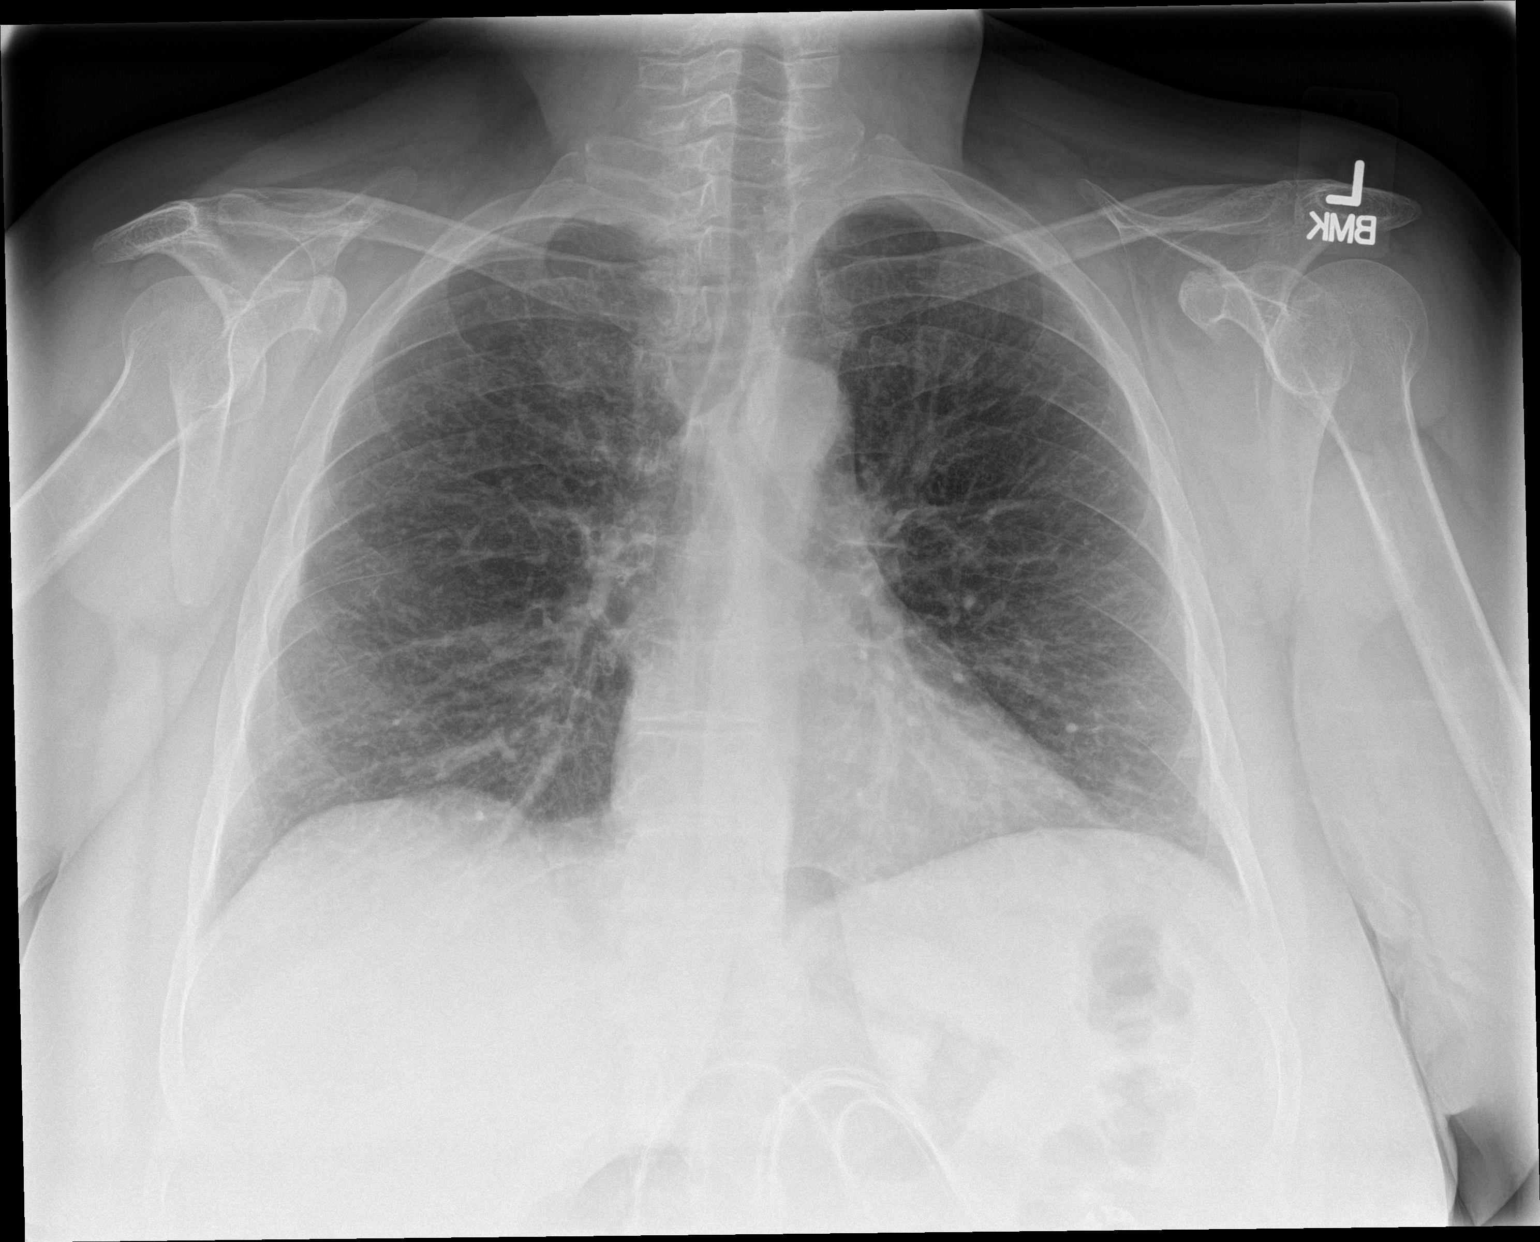

[chest lat]
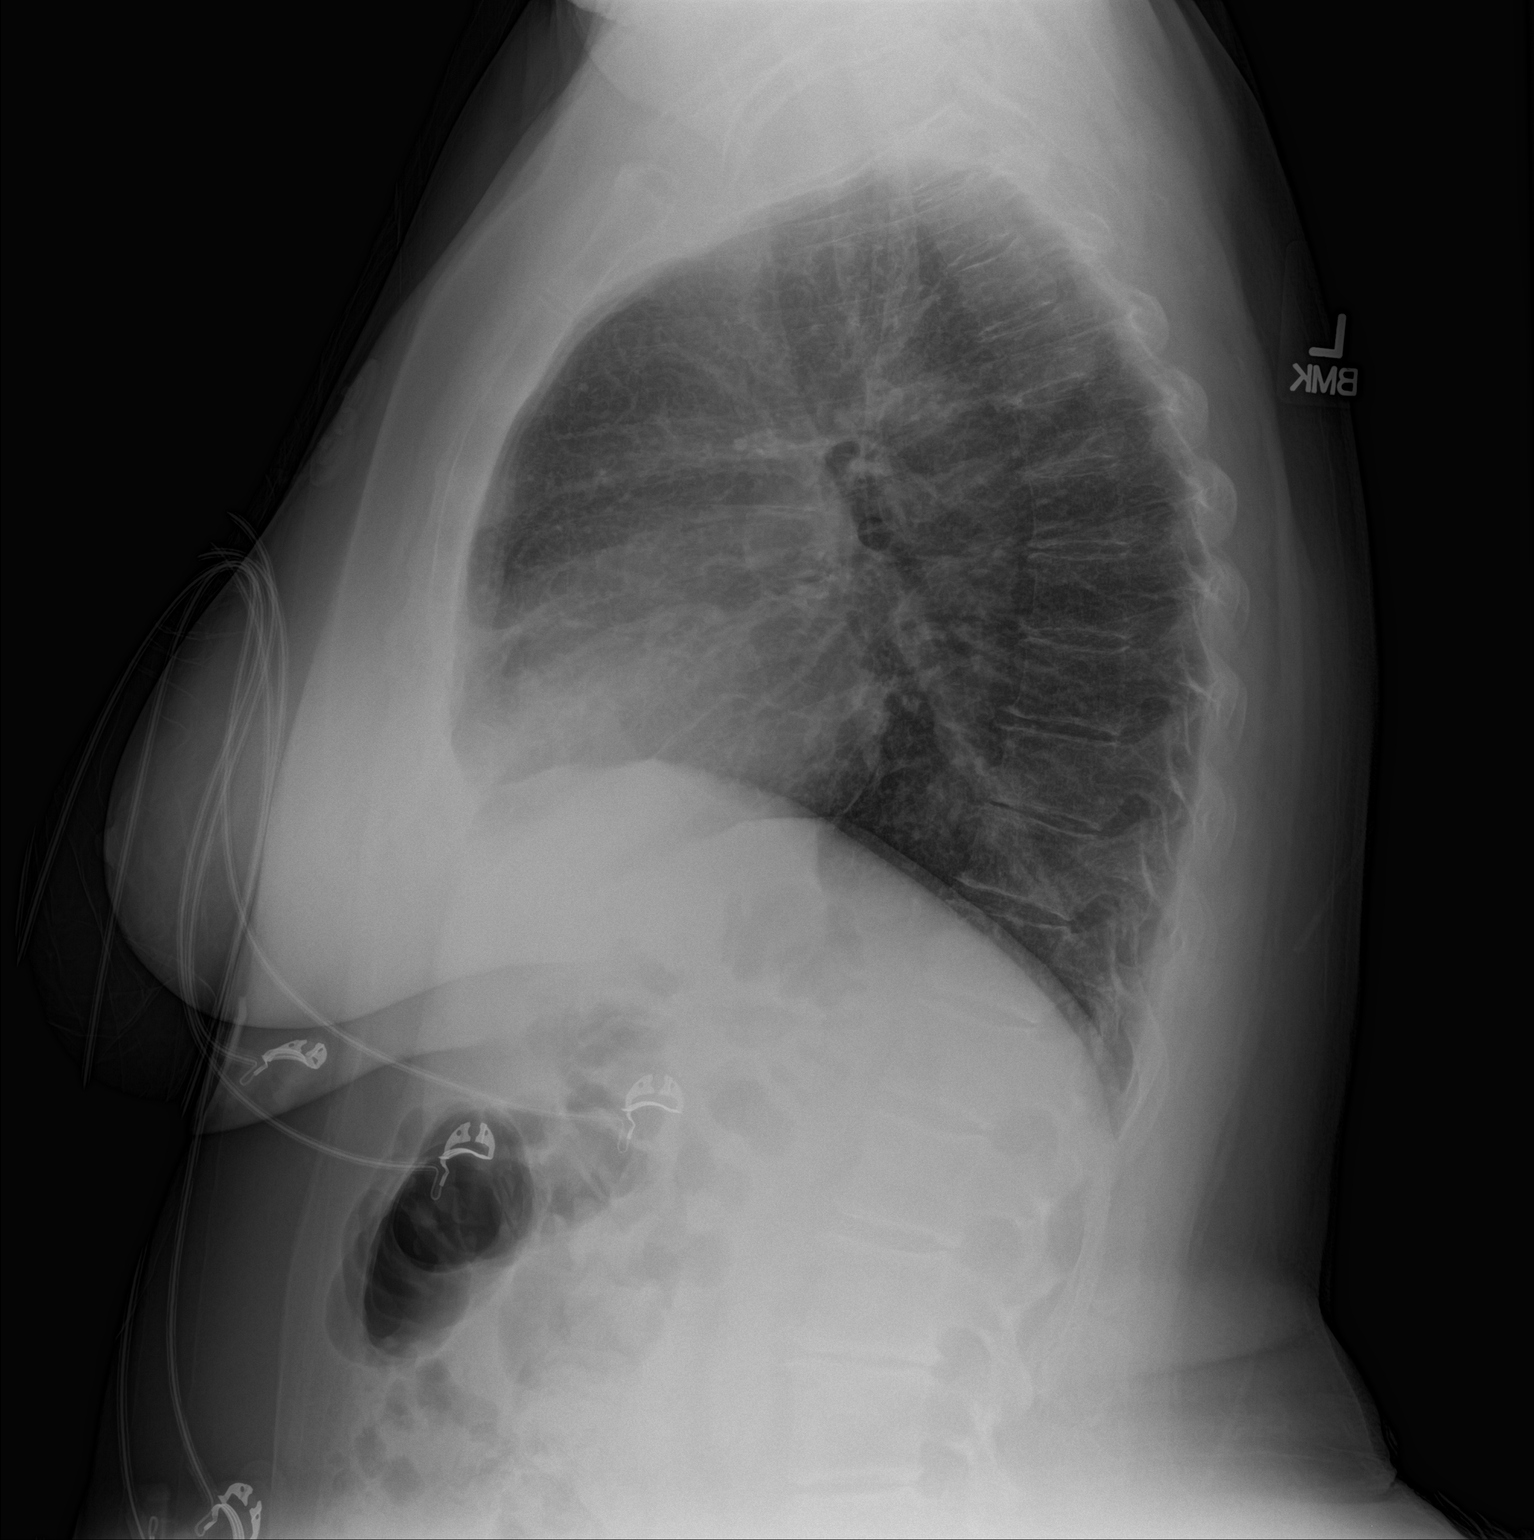

[2 of 2 positions shown; findings below may reference images not displayed]

FINDINGS: The lungs are well-aerated. Mild peribronchial thickening is noted.
There is no evidence of focal opacification, pleural effusion or
pneumothorax.

The heart is normal in size; the mediastinal contour is within
normal limits. No acute osseous abnormalities are seen.
IMPRESSION: Mild peribronchial thickening noted.  Lungs otherwise clear.

## 2018-02-22 ENCOUNTER — Ambulatory Visit (INDEPENDENT_AMBULATORY_CARE_PROVIDER_SITE_OTHER): Payer: BLUE CROSS/BLUE SHIELD

## 2018-02-22 ENCOUNTER — Encounter: Payer: Self-pay | Admitting: Emergency Medicine

## 2018-02-22 ENCOUNTER — Ambulatory Visit
Admission: EM | Admit: 2018-02-22 | Discharge: 2018-02-22 | Disposition: A | Discharge status: Home / Self Care | Payer: BLUE CROSS/BLUE SHIELD | Attending: Family Medicine | Admitting: Family Medicine

## 2018-02-22 DIAGNOSIS — M25561 Pain in right knee: Secondary | ICD-10-CM

## 2018-02-22 DIAGNOSIS — I10 Essential (primary) hypertension: Secondary | ICD-10-CM | POA: Diagnosis not present

## 2018-02-22 DIAGNOSIS — W010XXA Fall on same level from slipping, tripping and stumbling without subsequent striking against object, initial encounter: Secondary | ICD-10-CM | POA: Diagnosis not present

## 2018-02-22 DIAGNOSIS — S8001XA Contusion of right knee, initial encounter: Secondary | ICD-10-CM | POA: Insufficient documentation

## 2018-02-22 MED ORDER — MELOXICAM 15 MG PO TABS: 15.0000 mg | ORAL_TABLET | Freq: Every day | ORAL | 0 refills | Status: DC | PRN

## 2018-02-22 NOTE — ED Triage Notes (Signed)
Patient c/o falling on Monday hitting her right knee. Patient c/o pain and mild swelling to her right knee.

## 2018-02-22 NOTE — ED Provider Notes (Signed)
[facility] ____________________________________________  Time seen: Approximately 11:55 AM  I have reviewed the triage vital signs and the nursing notes.   HISTORY  Chief Complaint Knee Pain   HPI Holly Cordova is a 63 y.o. female presenting for evaluation of right knee pain after injury. Reports on Monday she tripped over her grandsons toy and fell forward, landing on right knee injuring it.  Reports right knee pain since.  States pain is predominantly with walking as well as extending.  States does have a bruise to the area.  Denies pain if sitting still and resting.  Denies any decreased range of motion.  Denies instability, pain radiation, paresthesias.  Denies other injury.  Denies head injury or loss of conscious.  Has been taken extra strength over-the-counter Tylenol without resolution.  Denies recurrent knee issues.  Reports otherwise doing well denies other complaints.  Dortha Kern, MD: PCP    Past Medical History:  Diagnosis Date  . Hypertension     There are no active problems to display for this patient.   Past Surgical History:  Procedure Laterality Date  . CESAREAN SECTION       No current facility-administered medications for this encounter.   Current Outpatient Medications:  .  hydrOXYzine (ATARAX/VISTARIL) 25 MG tablet, Take 25 mg by mouth 3 (three) times daily as needed., Disp: , Rfl:  .  meloxicam (MOBIC) 15 MG tablet, Take 1 tablet (15 mg total) by mouth daily as needed., Disp: 10 tablet, Rfl: 0  Allergies Morphine and related and Penicillins  History reviewed. No pertinent family history.  Social History Social History   Tobacco Use  . Smoking status: Current Every Day Smoker    Packs/day: 0.50  . Smokeless tobacco: Never Used  Substance Use Topics  . Alcohol use: No  . Drug use: No    Review of Systems Constitutional: No fever Cardiovascular: Denies chest pain. Respiratory: Denies shortness of  breath. Gastrointestinal: No abdominal pain.  Musculoskeletal: Negative for back pain. Positive right knee pain.  Skin: Negative for rash.   ____________________________________________   PHYSICAL EXAM:  VITAL SIGNS: ED Triage Vitals  Enc Vitals Group     BP 02/22/18 1016 (!) 158/88     Pulse Rate 02/22/18 1016 81     Resp 02/22/18 1016 18     Temp 02/22/18 1016 (!) 97.3 F (36.3 C)     Temp Source 02/22/18 1016 Oral     SpO2 02/22/18 1016 98 %     Weight 02/22/18 1014 200 lb (90.7 kg)     Height 02/22/18 1014 5\' 1"  (1.549 m)     Head Circumference --      Peak Flow --      Pain Score 02/22/18 1014 9     Pain Loc --      Pain Edu? --      Excl. in GC? --     Constitutional: Alert and oriented. Well appearing and in no acute distress. ENT      Head: Normocephalic and atraumatic. Cardiovascular: Normal rate, regular rhythm. Grossly normal heart sounds.  Good peripheral circulation. Respiratory: Normal respiratory effort without tachypnea nor retractions. Breath sounds are clear and equal bilaterally. No wheezes, rales, rhonchi. Musculoskeletal: Steady gait. Bilateral pedal pulses equal and easily palpated. Except: right medial anteriorlateral knee mild ecchymosis with small localized edema with mild to moderate direct tenderness, no pain with anterior posterior drawer test, no pain with medial or lateral stress, mild pain with full right  knee extension, but no pain with resisted knee extension, no pain with flexion, steady gait, right lower extremity otherwise nontender. Neurologic:  Normal speech and language. Speech is normal. No gait instability.  Skin:  Skin is warm, dry and intact. No rash noted. Psychiatric: Mood and affect are normal. Speech and behavior are normal. Patient exhibits appropriate insight and judgment   ___________________________________________   LABS (all labs ordered are listed, but only abnormal results are displayed)  Labs Reviewed - No data to  display RADIOLOGY  Dg Knee Complete 4 Views Right  Result Date: 02/22/2018 CLINICAL DATA:  Pt states she tripped 3 days ago and landed on right knee. Most pain medial anterolateral aspect of knee joint. Hurts to bear weight EXAM: RIGHT KNEE - COMPLETE 4+ VIEW COMPARISON:  None. FINDINGS: No acute fracture or dislocation. Mild medial femorotibial compartment joint space narrowing with marginal osteophytes. No aggressive osseous lesion. Soft tissues are normal. No joint effusion. Small loose body in the posterior joint space. IMPRESSION: No acute osseous injury of the right knee. Electronically Signed   By: Elige Ko   On: 02/22/2018 11:00   ____________________________________________ PROCEDURES Procedures   INITIAL IMPRESSION / ASSESSMENT AND PLAN / ED COURSE  Pertinent labs & imaging results that were available during my care of the patient were reviewed by me and considered in my medical decision making (see chart for details).  Well appearing patient. Right knee pain post mechanical injury. Right knee xray as above per radiologist and reviewed myself.  Right knee x-ray as above per radiologist, no acute osseous injury of the right knee.  Suspect contusion injury.  Discussed over-the-counter ace or sleeve for support, ice, rest, Mobic as needed.  Follow-up with orthopedic as needed for continued pain.Discussed indication, risks and benefits of medications with patient.  Discussed follow up with Primary care physician this week as needed. Discussed follow up and return parameters including no resolution or any worsening concerns. Patient verbalized understanding and agreed to plan.   ____________________________________________   FINAL CLINICAL IMPRESSION(S) / ED DIAGNOSES  Final diagnoses:  Acute pain of right knee  Contusion of right knee, initial encounter     ED Discharge Orders         Ordered    meloxicam (MOBIC) 15 MG tablet  Daily PRN     02/22/18 1120            Note: This dictation was prepared with Dragon dictation along with smaller phrase technology. Any transcriptional errors that result from this process are unintentional.         Renford Dills, NP 02/22/18 1200

## 2018-02-22 NOTE — Discharge Instructions (Addendum)
Take medication as prescribed. Rest. Ice. Stretch.   Follow up with orthopedic as needed.   Follow up with your primary care physician this week as needed. Return to Urgent care for new or worsening concerns.

## 2019-11-08 ENCOUNTER — Ambulatory Visit (INDEPENDENT_AMBULATORY_CARE_PROVIDER_SITE_OTHER): Payer: Medicare HMO

## 2019-11-08 ENCOUNTER — Other Ambulatory Visit: Payer: Self-pay

## 2019-11-08 ENCOUNTER — Encounter: Payer: Self-pay | Admitting: Emergency Medicine

## 2019-11-08 ENCOUNTER — Ambulatory Visit
Admission: EM | Admit: 2019-11-08 | Discharge: 2019-11-08 | Disposition: A | Discharge status: Home / Self Care | Payer: Medicare HMO

## 2019-11-08 DIAGNOSIS — M25562 Pain in left knee: Secondary | ICD-10-CM

## 2019-11-08 DIAGNOSIS — M25561 Pain in right knee: Secondary | ICD-10-CM

## 2019-11-08 DIAGNOSIS — M1712 Unilateral primary osteoarthritis, left knee: Secondary | ICD-10-CM

## 2019-11-08 MED ORDER — MELOXICAM 7.5 MG PO TABS: 7.5000 mg | ORAL_TABLET | Freq: Every day | ORAL | 0 refills | Status: DC

## 2019-11-08 MED ORDER — KETOROLAC TROMETHAMINE 60 MG/2ML IM SOLN
30.0000 mg | Freq: Once | INTRAMUSCULAR | Status: AC
  Administered 2019-11-08: 30 mg via INTRAMUSCULAR

## 2019-11-08 NOTE — ED Triage Notes (Signed)
Pt c/o left knee pain. Started today. She denies any known injury. She states she has been taking tylenol but has not helped. She states pain is worse when baring weight.

## 2019-11-08 NOTE — Discharge Instructions (Signed)
Your xray shows arthritis in your knee and is worse on the middle of your knee right where you are hurting.

## 2019-11-08 NOTE — ED Provider Notes (Signed)
[facility]    CSN: [id number] Arrival date & time: 11/08/19  1709      History   Chief Complaint Chief Complaint  Patient presents with  . Knee Pain    left    HPI Holly Cordova is a 64 y.o. female who present with onset of L knee pain since today. Denies injuring herself and tylenol is not helping. Pain is worse when trying to bare wt. She states she woke up with L medial knee pain, but was mild and she went about her usual day like going to the store and walking her dogs. As the day progressed her pain has been getting worse and barring wt or touching the medial joint knee area provokes her pain. She has mild arthritis of her R  Medial knee per xrays I reviewed today.     Past Medical History:  Diagnosis Date  . Hypertension     There are no problems to display for this patient.   Past Surgical History:  Procedure Laterality Date  . CESAREAN SECTION      OB History   No obstetric history on file.      Home Medications    Prior to Admission medications   Medication Sig Start Date End Date Taking? Authorizing Provider  propranolol ER (INDERAL LA) 60 MG 24 hr capsule Take by mouth. 11/06/19  Yes [provider]  meloxicam (MOBIC) 7.5 MG tablet Take 1 tablet (7.5 mg total) by mouth daily. For arthritis pain 11/08/19   Rodriguez-Southworth, Nettie Elm, PA-C    Family History History reviewed. No pertinent family history.  Social History Social History   Tobacco Use  . Smoking status: Current Every Day Smoker    Packs/day: 0.50  . Smokeless tobacco: Never Used  Vaping Use  . Vaping Use: Never used  Substance Use Topics  . Alcohol use: No  . Drug use: No     Allergies   Morphine and related and Penicillins   Review of Systems Review of Systems  Musculoskeletal: Positive for arthralgias. Negative for joint swelling.  Skin: Negative for color change and rash.  Neurological: Negative for numbness.     Physical Exam Triage  Vital Signs ED Triage Vitals  Enc Vitals Group     BP 11/08/19 1739 (!) 156/79     Pulse Rate 11/08/19 1739 81     Resp 11/08/19 1739 18     Temp 11/08/19 1739 99.5 F (37.5 C)     Temp Source 11/08/19 1739 Oral     SpO2 11/08/19 1739 96 %     Weight 11/08/19 1735 199 lb 15.3 oz (90.7 kg)     Height 11/08/19 1735 5\' 1"  (1.549 m)     Head Circumference --      Peak Flow --      Pain Score 11/08/19 1735 9     Pain Loc --      Pain Edu? --      Excl. in GC? --    No data found.  Updated Vital Signs BP (!) 156/79 (BP Location: Right Arm)   Pulse 81   Temp 99.5 F (37.5 C) (Oral)   Resp 18   Ht 5\' 1"  (1.549 m)   Wt 199 lb 15.3 oz (90.7 kg)   SpO2 96%   BMI 37.78 kg/m   Visual Acuity Right Eye Distance:   Left Eye Distance:   Bilateral Distance:    Right Eye Near:   Left Eye Near:  Bilateral Near:     Physical Exam Vitals and nursing note reviewed.  Constitutional:      General: She is not in acute distress.    Appearance: She is obese. She is not toxic-appearing.     Comments: She is sitting on a wheelchair  HENT:     Right Ear: External ear normal.     Left Ear: External ear normal.  Eyes:     General: No scleral icterus.    Conjunctiva/sclera: Conjunctivae normal.  Pulmonary:     Effort: Pulmonary effort is normal.  Musculoskeletal:        General: No swelling or deformity.     Cervical back: Neck supple.     Right lower leg: No edema.     Left lower leg: No edema.     Comments: L KNEE- with mild crepitation with ROM. Has local tenderness on the medial joint line. There is no effusion, redness or warmth.   Skin:    General: Skin is warm and dry.  Neurological:     Mental Status: She is alert and oriented to person, place, and time.     Gait: Gait abnormal.  Psychiatric:        Mood and Affect: Mood normal.        Behavior: Behavior normal.        Thought Content: Thought content normal.        Judgment: Judgment normal.      UC Treatments /  Results  Labs (all labs ordered are listed, but only abnormal results are displayed) Labs Reviewed - No data to display  EKG   Radiology DG Knee AP/LAT W/Sunrise Left  Result Date: 11/08/2019 CLINICAL DATA:  Medial knee pain for 1 day. EXAM: LEFT KNEE 3 VIEWS COMPARISON:  No comparison studies available. FINDINGS: No evidence for an acute fracture. No subluxation or dislocation. Degenerative changes are noted in all 3 compartments, most advanced in the medial compartment. Meniscal calcification evident. No joint effusion. IMPRESSION: 1. No acute bony abnormality. 2. Tricompartmental degenerative changes, most advanced in the medial compartment. Electronically Signed   By: Kennith Center M.D.   On: 11/08/2019 18:29    Procedures Procedures (including critical care time)  Medications Ordered in UC Medications - No data to display  Initial Impression / Assessment and Plan / UC Course  I have reviewed the triage vital signs and the nursing notes. Has medial knee OA.  She was given Toradol 30 mg IM here  And Mobic sent to the pharmacy as noted.  See instructions.  Pertinent imaging results that were available during my care of the patient were reviewed by me and considered in my medical decision making (see chart for details).   Final Clinical Impressions(s) / UC Diagnoses   Final diagnoses:  Acute pain of right knee  Primary osteoarthritis of left knee     Discharge Instructions     Your xray shows arthritis in your knee and is worse on the middle of your knee right where you are hurting.     ED Prescriptions    Medication Sig Dispense Auth. Provider   meloxicam (MOBIC) 7.5 MG tablet Take 1 tablet (7.5 mg total) by mouth daily. For arthritis pain 30 tablet Rodriguez-Southworth, Nettie Elm, PA-C     PDMP not reviewed this encounter.   Garey Ham, Cordelia Poche 11/08/19 1902

## 2019-11-09 ENCOUNTER — Telehealth: Payer: Self-pay

## 2019-11-09 DIAGNOSIS — M25561 Pain in right knee: Secondary | ICD-10-CM

## 2019-11-09 DIAGNOSIS — M1712 Unilateral primary osteoarthritis, left knee: Secondary | ICD-10-CM

## 2019-11-09 MED ORDER — MELOXICAM 7.5 MG PO TABS: 7.5000 mg | ORAL_TABLET | Freq: Every day | ORAL | 0 refills | Status: AC

## 2019-11-09 NOTE — Telephone Encounter (Signed)
Pt called to UC.  Pharmacy does not have medication that was supposed to be sent 09/30.  Reviewed chart.  Mobic shows transmission still in progress.  Resending Rx.

## 2020-12-30 LAB — EXTERNAL GENERIC LAB PROCEDURE: COLOGUARD: NEGATIVE

## 2020-12-30 LAB — COLOGUARD: COLOGUARD: NEGATIVE

## 2021-12-30 ENCOUNTER — Ambulatory Visit
Admission: EM | Admit: 2021-12-30 | Discharge: 2021-12-30 | Disposition: A | Discharge status: Home / Self Care | Payer: Medicare HMO | Attending: Emergency Medicine | Admitting: Emergency Medicine

## 2021-12-30 DIAGNOSIS — J9801 Acute bronchospasm: Secondary | ICD-10-CM | POA: Diagnosis not present

## 2021-12-30 DIAGNOSIS — Z20822 Contact with and (suspected) exposure to covid-19: Secondary | ICD-10-CM | POA: Insufficient documentation

## 2021-12-30 DIAGNOSIS — I10 Essential (primary) hypertension: Secondary | ICD-10-CM | POA: Insufficient documentation

## 2021-12-30 DIAGNOSIS — T483X1A Poisoning by antitussives, accidental (unintentional), initial encounter: Secondary | ICD-10-CM | POA: Diagnosis present

## 2021-12-30 DIAGNOSIS — J069 Acute upper respiratory infection, unspecified: Secondary | ICD-10-CM | POA: Diagnosis present

## 2021-12-30 DIAGNOSIS — J9601 Acute respiratory failure with hypoxia: Secondary | ICD-10-CM | POA: Diagnosis not present

## 2021-12-30 LAB — RESP PANEL BY RT-PCR (RSV, FLU A&B, COVID)  RVPGX2
Influenza A by PCR: NEGATIVE
Influenza B by PCR: NEGATIVE
Resp Syncytial Virus by PCR: NEGATIVE
SARS Coronavirus 2 by RT PCR: NEGATIVE

## 2021-12-30 MED ORDER — ALBUTEROL SULFATE (2.5 MG/3ML) 0.083% IN NEBU
2.5000 mg | INHALATION_SOLUTION | Freq: Once | RESPIRATORY_TRACT | Status: AC
  Administered 2021-12-30: 2.5 mg via RESPIRATORY_TRACT

## 2021-12-30 MED ORDER — ALBUTEROL SULFATE HFA 108 (90 BASE) MCG/ACT IN AERS: 1.0000 | INHALATION_SPRAY | RESPIRATORY_TRACT | 0 refills | Status: AC | PRN

## 2021-12-30 MED ORDER — AEROCHAMBER MV MISC: 1 refills | Status: AC

## 2021-12-30 MED ORDER — BENZONATATE 200 MG PO CAPS: 200.0000 mg | ORAL_CAPSULE | Freq: Three times a day (TID) | ORAL | 0 refills | Status: AC | PRN

## 2021-12-30 MED ORDER — IPRATROPIUM-ALBUTEROL 0.5-2.5 (3) MG/3ML IN SOLN
3.0000 mL | Freq: Once | RESPIRATORY_TRACT | Status: AC
  Administered 2021-12-30: 3 mL via RESPIRATORY_TRACT

## 2021-12-30 NOTE — ED Provider Notes (Signed)
HPI  SUBJECTIVE:  Holly Cordova is a 66 y.o. female who presents with "feeling out of it, drunk" with difficulty walking after drinking 89 mL of children's Delsym over a 12-hour period.  There was nothing other than dextromethorphan in the Delsym that she took.  Last dose was at 0700 this morning.  She denies hallucinations, palpitations, nausea, vomiting, abdominal pain, chest pain.  She has been coughing, wheezing, short of breath and short of breath with exertion for the past 5 days after her chlorthalidone dose was increased.  She also reports headaches, nasal congestion, rhinorrhea, postnasal drip, sore throat, sinus pain and pressure.  No facial swelling, upper dental pain, body aches, nausea, vomiting, diarrhea, abdominal pain.  No known COVID or flu exposure.  She did not get the COVID or flu vaccines.  No aggravating or alleviating factors.  She has not tried anything for symptoms.  She had a negative home COVID test earlier this week.  She has a past medical history of hypertension, is on propranolol ER 60 mg and chlorthalidone was added 2 weeks ago.  She was at her 2-week checkup earlier this week, blood pressure still found to be elevated, and her dose of chlorthalidone was increased to 1 tab twice daily.  She states that she has discontinued the second dose of chlorthalidone.  She states that her blood pressure was 148/90 this morning.  She did not take her blood pressure medication this morning.  She has been smoking for over 50 years, quit this week.  No history of pulmonary disease, chronic kidney disease, liver disease.  PCP: [facility].   Past Medical History:  Diagnosis Date   Hypertension     Past Surgical History:  Procedure Laterality Date   CESAREAN SECTION      History reviewed. No pertinent family history.  Social History   Tobacco Use   Smoking status: Every Day    Packs/day: 0.50    Types: Cigarettes   Smokeless tobacco: Never  Vaping Use   Vaping Use:  Never used  Substance Use Topics   Alcohol use: No   Drug use: No    No current facility-administered medications for this encounter.  Current Outpatient Medications:    albuterol (VENTOLIN HFA) 108 (90 Base) MCG/ACT inhaler, Inhale 1-2 puffs into the lungs every 4 (four) hours as needed for wheezing or shortness of breath., Disp: 1 each, Rfl: 0   benzonatate (TESSALON) 200 MG capsule, Take 1 capsule (200 mg total) by mouth 3 (three) times daily as needed for cough., Disp: 30 capsule, Rfl: 0   Spacer/Aero-Holding Chambers (AEROCHAMBER MV) inhaler, Use as instructed, Disp: 1 each, Rfl: 1   propranolol ER (INDERAL LA) 60 MG 24 hr capsule, Take by mouth., Disp: , Rfl:   Allergies  Allergen Reactions   Morphine And Related Nausea And Vomiting   Penicillins Other (See Comments)    Goes blind     ROS  As noted in HPI.   Physical Exam  BP (!) 176/107 (BP Location: Left Arm)   Pulse 78   Temp 97.9 F (36.6 C) (Oral)   Ht 5\' 1"  (1.549 m)   Wt 86.6 kg   SpO2 92%   BMI 36.09 kg/m   Repeat blood pressure 150/98  Constitutional: Well developed, well nourished, no acute distress Eyes:  EOMI, conjunctiva normal bilaterally, no nystagmus HENT: Normocephalic, atraumatic,mucus membranes moist Respiratory: Normal inspiratory effort, poor air movement, diffuse expiratory wheezing throughout all lung fields.  Occasional rhonchi. Cardiovascular:  Normal rate, regular rhythm, no murmurs, rubs, gallops GI: nondistended skin: No rash, skin intact Musculoskeletal: no deformities Neurologic: Alert & oriented x 3, cranial nerves III through XII intact, coordination grossly normal.  Gait steady.  Difficulty with tandem gait.  Romberg negative. Psychiatric: Speech and behavior appropriate   ED Course   Medications  albuterol (PROVENTIL) (2.5 MG/3ML) 0.083% nebulizer solution 2.5 mg (2.5 mg Nebulization Given 12/30/21 1127)  ipratropium-albuterol (DUONEB) 0.5-2.5 (3) MG/3ML nebulizer  solution 3 mL (3 mLs Nebulization Given 12/30/21 1127)    Orders Placed This Encounter  Procedures   Resp panel by RT-PCR (RSV, Flu A&B, Covid) Anterior Nasal Swab    Standing Status:   Standing    Number of Occurrences:   1   ED EKG    Dextromethorphan toxicity    Standing Status:   Standing    Number of Occurrences:   1    Order Specific Question:   Reason for Exam    Answer:   Other (See Comments)   EKG 12-Lead    Standing Status:   Standing    Number of Occurrences:   1    Results for orders placed or performed during the hospital encounter of 12/30/21 (from the past 24 hour(s))  Resp panel by RT-PCR (RSV, Flu A&B, Covid) Anterior Nasal Swab     Status: None   Collection Time: 12/30/21 11:13 AM   Specimen: Anterior Nasal Swab  Result Value Ref Range   SARS Coronavirus 2 by RT PCR NEGATIVE NEGATIVE   Influenza A by PCR NEGATIVE NEGATIVE   Influenza B by PCR NEGATIVE NEGATIVE   Resp Syncytial Virus by PCR NEGATIVE NEGATIVE   No results found.  ED Clinical Impression  1. Dextromethorphan overdose, accidental or unintentional, initial encounter   2. Viral URI with cough   3. Encounter for laboratory testing for COVID-19 virus   4. Elevated blood pressure reading with diagnosis of hypertension   5. Bronchospasm, acute      ED Assessment/Plan     1.  Unintentional dextromethorphan overdose.  This does not seem to be a suicide attempt.  She denies suicidal ideation.  D/w poison control. She is under the toxic dose. This may last 6 hr-1 day. 4 hours observation after onset sx. No reason for labs.  Encouraged hydration.  EKG: Normal sinus rhythm, rate 75.  Normal axis, normal intervals.  No hypertrophy.  Nonspecific ST-T wave changes.  No ST elevation.  No changes compared to EKG from 2016.  2.  Cough, wheezing.  She is satting 91 to 93% on room air for me.  Giving DuoNeb 5/0.5 mg and will reevaluate.  Checking COVID, flu, RSV because she is not vaccinated.  COVID, flu,  RSV negative.  On reevaluation, patient is that she feels much better.  O2 sat 96%.  Still with fair air movement, diffuse expiratory wheezing, prolonged expiratory phase.  Sending home with regularly scheduled albuterol inhaler with a spacer, prednisone 40 mg for 5 days, Tessalon.  I suspect that she has a component of pulmonary disease/COPD as she has been smoking for over 50 years.  3.  Elevated blood pressure reading with diagnosis of hypertension.  Repeat blood pressure on my exam 150/98.  She is to keep an eye on this.  Discussed labs,  MDM, treatment plan, and plan for follow-up with patient. Discussed sn/sx that should prompt return to the ED. patient agrees with plan.   Meds ordered this encounter  Medications   albuterol (PROVENTIL) (  2.5 MG/3ML) 0.083% nebulizer solution 2.5 mg   ipratropium-albuterol (DUONEB) 0.5-2.5 (3) MG/3ML nebulizer solution 3 mL   albuterol (VENTOLIN HFA) 108 (90 Base) MCG/ACT inhaler    Sig: Inhale 1-2 puffs into the lungs every 4 (four) hours as needed for wheezing or shortness of breath.    Dispense:  1 each    Refill:  0   Spacer/Aero-Holding Chambers (AEROCHAMBER MV) inhaler    Sig: Use as instructed    Dispense:  1 each    Refill:  1   benzonatate (TESSALON) 200 MG capsule    Sig: Take 1 capsule (200 mg total) by mouth 3 (three) times daily as needed for cough.    Dispense:  30 capsule    Refill:  0      *This clinic note was created using Scientist, clinical (histocompatibility and immunogenetics). Therefore, there may be occasional mistakes despite careful proofreading.  ?    Domenick Gong, MD 12/30/21 1544

## 2021-12-30 NOTE — Discharge Instructions (Signed)
2 puffs from your albuterol inhaler every 4 hours for 2 days, then every 6 hours for 2 days, then as needed.  You can back off in the albuterol if you start to improve sooner.  Finish prednisone, even if you feel better.  Saline nasal irrigation with a NeilMed sinus rinse and distilled water as often as you want, Mucinex for any nasal congestion and to help prevent a bacterial sinus infection. Your COVID, flu, and RSV were negative.  Keep an eye on your blood pressure.  Follow-up with your primary care provider if it gets worse.  Decrease your salt intake. diet and exercise will lower your blood pressure significantly. It is important to keep your blood pressure under good control, as having a elevated blood pressure for prolonged periods of time significantly increases your risk of stroke, heart attacks, kidney damage, eye damage, and other problems. Measure your blood pressure once a day, preferably at the same time every day. Keep a log of this and bring it to your next doctor's appointment.  Bring your blood pressure cuff as well.  Return here in 2 weeks for blood pressure recheck if you're unable to find a primary care physician by then. Return immediately to the ER if you start having chest pain, headache, problems seeing, problems talking, problems walking, if you feel like you're about to pass out, if you do pass out, if you have a seizure, or for any other concerns.  Go to www.goodrx.com  or www.costplusdrugs.com to look up your medications. This will give you a list of where you can find your prescriptions at the most affordable prices. Or ask the pharmacist what the cash price is, or if they have any other discount programs available to help make your medication more affordable. This can be less expensive than what you would pay with insurance.

## 2021-12-30 NOTE — ED Triage Notes (Signed)
Patient reports that she got up in the middle of the night and she got up last night and drunk about 1/2 a bottle of Childrens delsym.   Patient reports that her PCP had put her on chlorthalidone -- 25mg  twice a day - that caused her to have a cough and hoarseness so they went back down to 1 tablet daily.

## 2022-01-01 ENCOUNTER — Ambulatory Visit: Admit: 2022-01-01 | Discharge status: Absent without leave | Payer: Medicare HMO

## 2022-01-10 IMAGING — CR DG KNEE AP/LAT W/ SUNRISE*L*
3 series · 3 of 3 positions shown · non-contrast
Comparison: No comparison studies available.

CLINICAL DATA: Medial knee pain for 1 day.

EXAM:
LEFT KNEE 3 VIEWS

[knee ap]
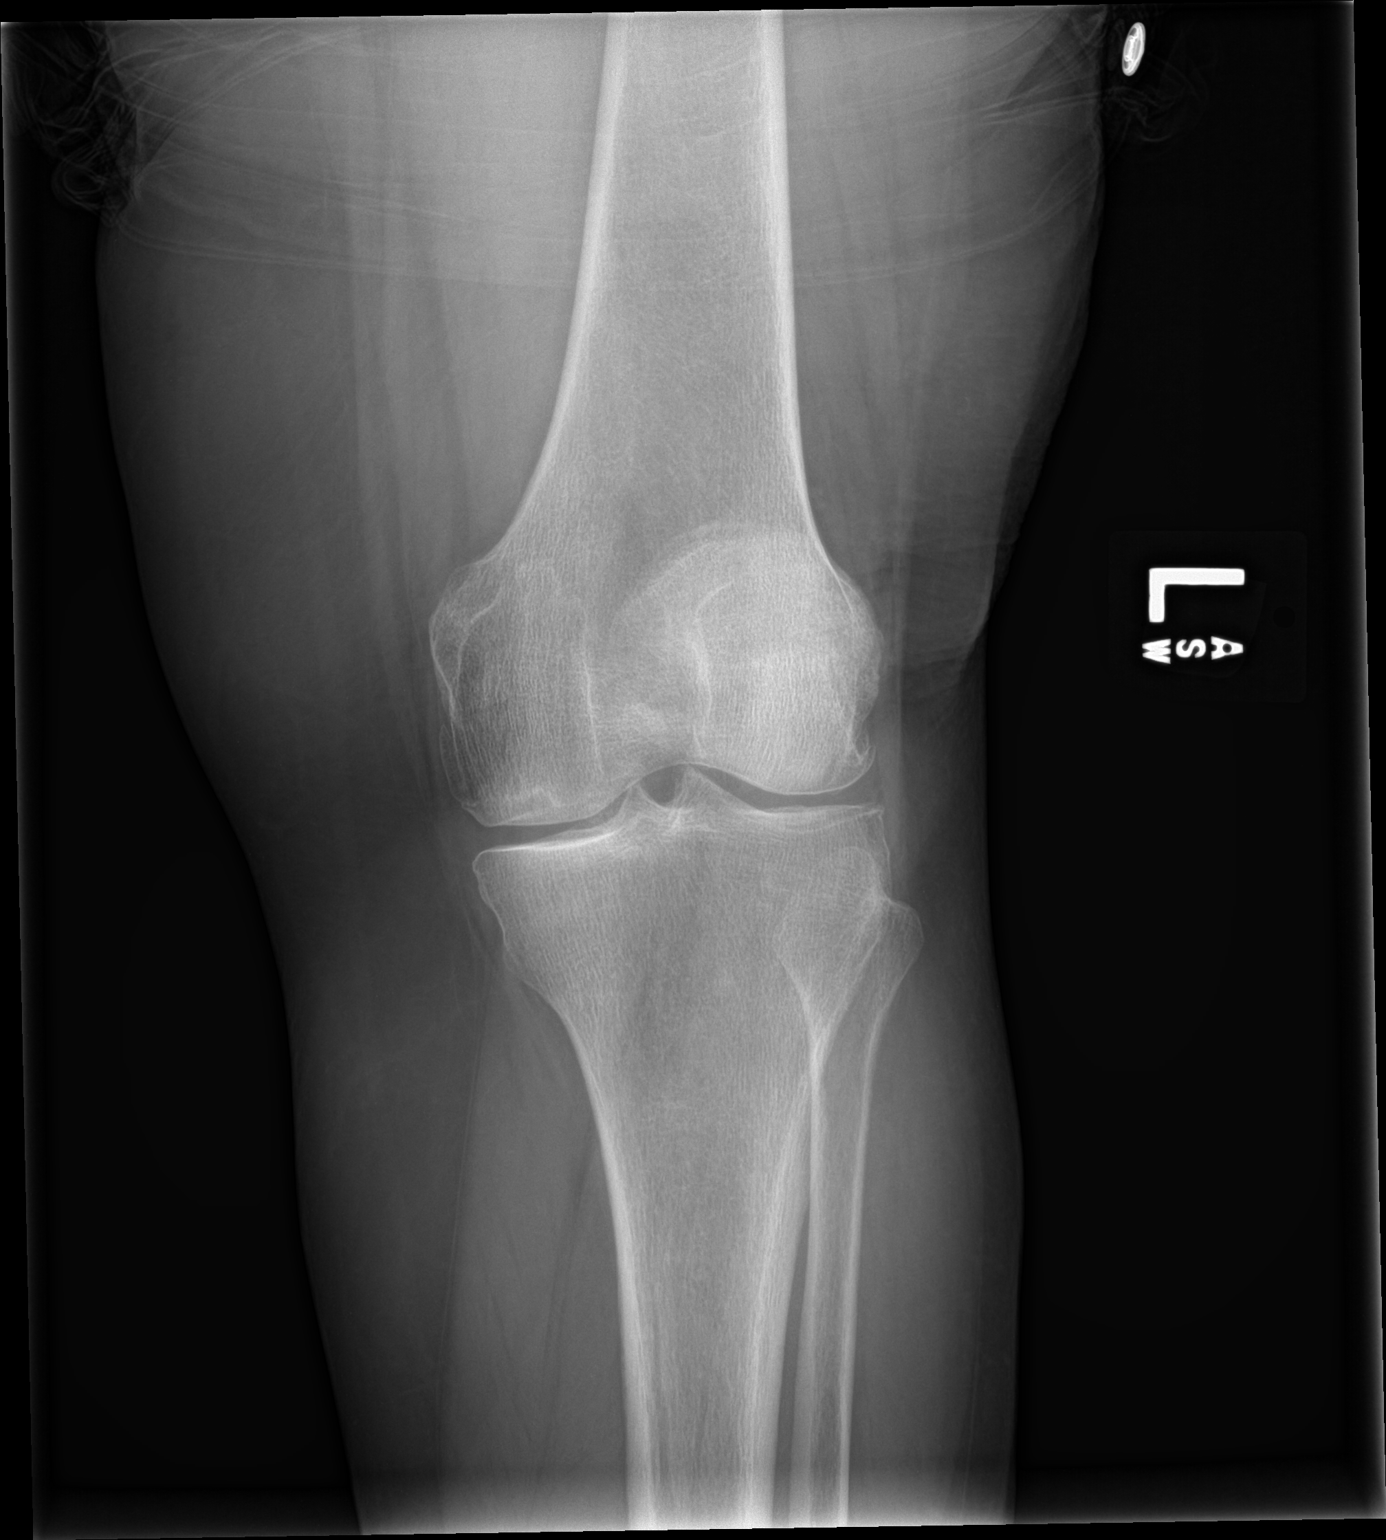

[patella skyline]
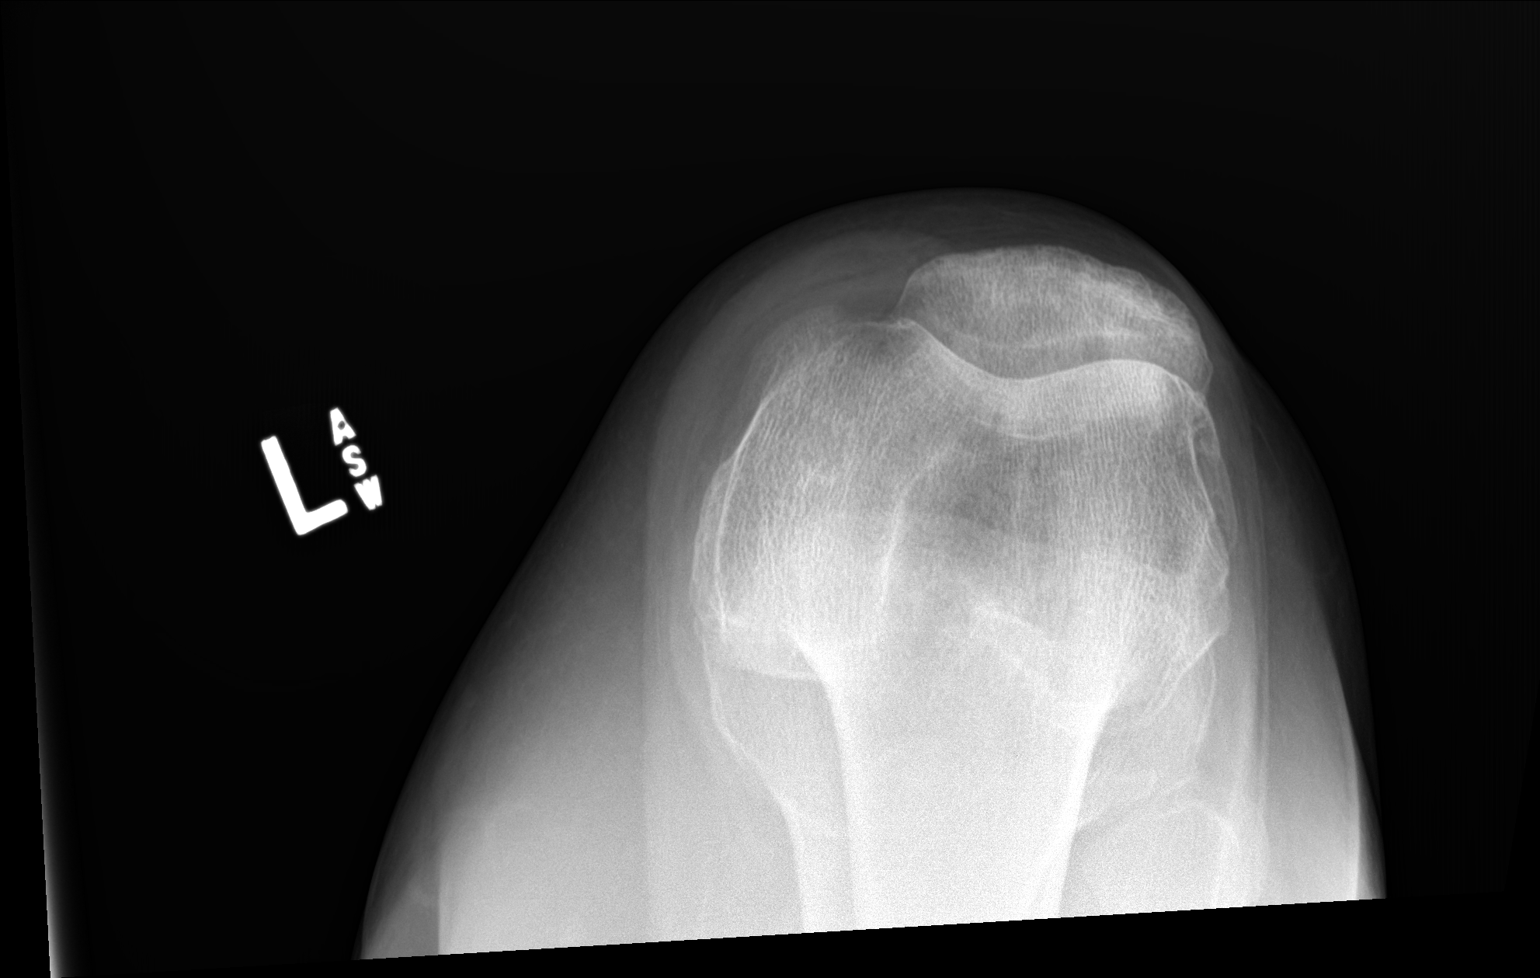

[knee lat]
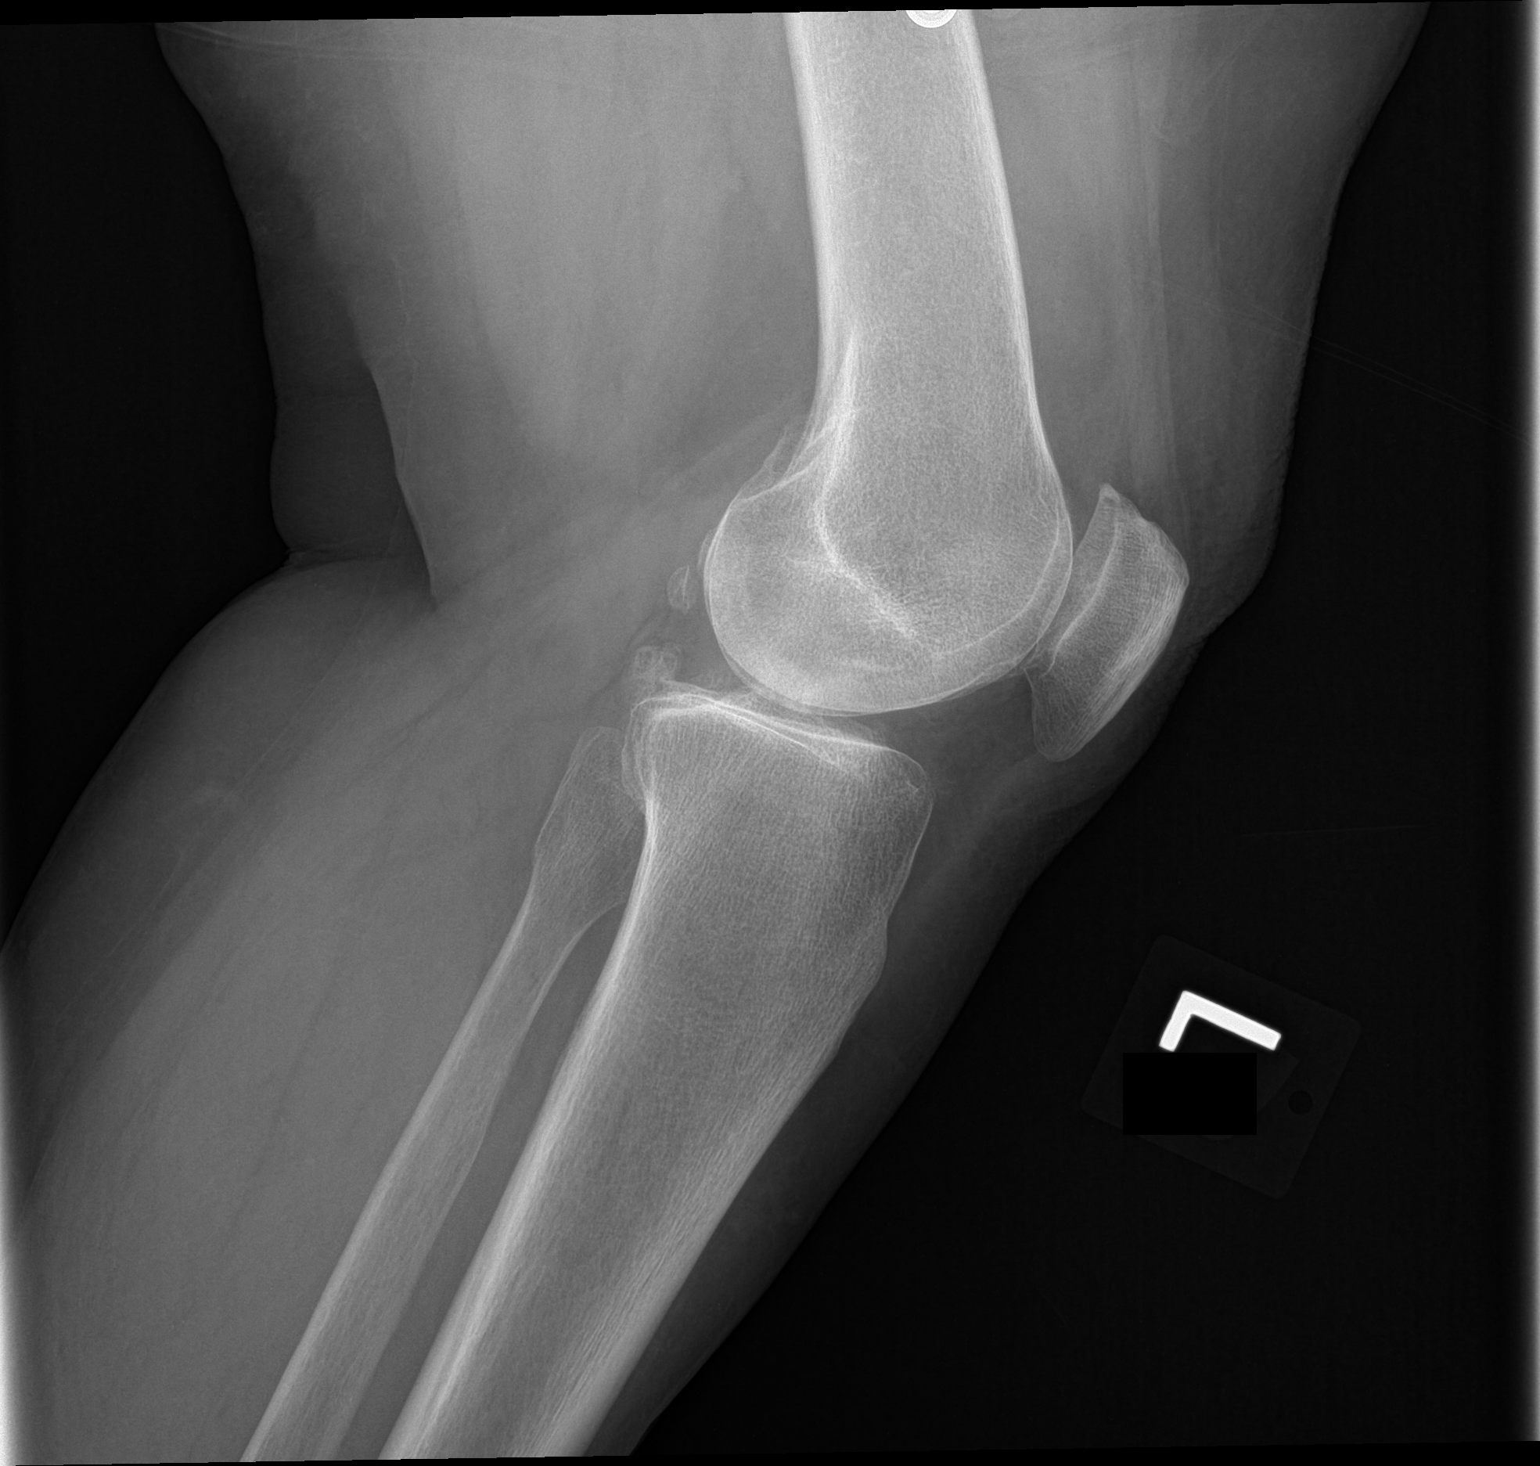

[3 of 3 positions shown; findings below may reference images not displayed]

FINDINGS: No evidence for an acute fracture. No subluxation or dislocation.
Degenerative changes are noted in all 3 compartments, most advanced
in the medial compartment. Meniscal calcification evident. No joint
effusion.
IMPRESSION: 1. No acute bony abnormality.
2. Tricompartmental degenerative changes, most advanced in the
medial compartment.

## 2023-05-27 ENCOUNTER — Ambulatory Visit
Admission: EM | Admit: 2023-05-27 | Discharge: 2023-05-27 | Disposition: A | Discharge status: Home / Self Care | Attending: Family Medicine | Admitting: Family Medicine

## 2023-05-27 DIAGNOSIS — Z23 Encounter for immunization: Secondary | ICD-10-CM

## 2023-05-27 DIAGNOSIS — S61211A Laceration without foreign body of left index finger without damage to nail, initial encounter: Secondary | ICD-10-CM

## 2023-05-27 MED ORDER — DOXYCYCLINE HYCLATE 100 MG PO CAPS: 100.0000 mg | ORAL_CAPSULE | Freq: Two times a day (BID) | ORAL | 0 refills | Status: AC

## 2023-05-27 MED ORDER — TETANUS-DIPHTH-ACELL PERTUSSIS 5-2.5-18.5 LF-MCG/0.5 IM SUSY
0.5000 mL | PREFILLED_SYRINGE | Freq: Once | INTRAMUSCULAR | Status: AC
  Administered 2023-05-27: 0.5 mL via INTRAMUSCULAR

## 2023-05-27 NOTE — Discharge Instructions (Signed)
Stop by the pharmacy to pick up your prescriptions.  Follow up with your primary care provider or return to urgent care to have sutures removed in 10-14 days.  Watch for signs and symptoms of infection like fever worsening redness or increasing pain. See handout for more information.   

## 2023-05-27 NOTE — ED Provider Notes (Signed)
 [facility]    CSN: [id number] Arrival date & time: 05/27/23  1124      History   Chief Complaint Chief Complaint  Patient presents with   Finger Injury    HPI Holly Cordova is a 68 y.o. female.   HPI  Holly Cordova presents for left index finger injury that occurred at home around 1015 AM today.  She was putting the dog in the crate and when she was closing the crate it "peeled the skin back."  She got the bleeding to slow down prior to arrival.  She is not sure when her last tetanus was but her doctor told her to go to [facility] and get one.  She hasn't gotten her tetanus yet.   There is been no new products including soaps and detergents.  No eye irritation, sore throat, difficulty breathing, nausea, vomiting or diarrhea.  Denies belly pain, joint pain and fever.  There has been no medication changes or new supplements.  Denies any new foods or drinks.   She is right handed.    Past Medical History:  Diagnosis Date   Hypertension     There are no active problems to display for this patient.   Past Surgical History:  Procedure Laterality Date   CESAREAN SECTION      OB History   No obstetric history on file.      Home Medications    Prior to Admission medications   Medication Sig Start Date End Date Taking? Authorizing Provider  albuterol  (VENTOLIN  HFA) 108 (90 Base) MCG/ACT inhaler Inhale 1-2 puffs into the lungs every 4 (four) hours as needed for wheezing or shortness of breath. 12/30/21  Yes Mortenson, Ashley, MD  buPROPion (WELLBUTRIN SR) 150 MG 12 hr tablet Take 150 mg by mouth. 11/06/19  Yes [provider]  chlorthalidone (HYGROTON) 25 MG tablet Take 25 mg by mouth every morning.   Yes [provider]  doxycycline  (VIBRAMYCIN ) 100 MG capsule Take 1 capsule (100 mg total) by mouth 2 (two) times daily. 05/27/23  Yes Stuart Guillen, DO  propranolol ER (INDERAL LA) 60 MG 24 hr capsule Take by mouth. 11/06/19  Yes [provider]   Spacer/Aero-Holding Chambers (AEROCHAMBER MV) inhaler Use as instructed 12/30/21  Yes Ethlyn Herd, MD  telmisartan (MICARDIS) 20 MG tablet Take 1 tablet by mouth daily. 03/29/23 03/28/24 Yes [provider]  benzonatate  (TESSALON ) 200 MG capsule Take 1 capsule (200 mg total) by mouth 3 (three) times daily as needed for cough. 12/30/21   Ethlyn Herd, MD    Family History History reviewed. No pertinent family history.  Social History Social History   Tobacco Use   Smoking status: Former    Current packs/day: 0.50    Types: Cigarettes   Smokeless tobacco: Never  Vaping Use   Vaping status: Never Used  Substance Use Topics   Alcohol use: No   Drug use: No     Allergies   Morphine and codeine and Penicillins   Review of Systems Review of Systems :negative unless otherwise stated in HPI.      Physical Exam Triage Vital Signs ED Triage Vitals  Encounter Vitals Group     BP 05/27/23 1131 135/89     Systolic BP Percentile --      Diastolic BP Percentile --      Pulse Rate 05/27/23 1131 80     Resp --      Temp 05/27/23 1131 97.7 F (36.5 C)  Temp Source 05/27/23 1131 Oral     SpO2 05/27/23 1131 97 %     Weight 05/27/23 1133 200 lb (90.7 kg)     Height 05/27/23 1133 5\' 1"  (1.549 m)     Head Circumference --      Peak Flow --      Pain Score 05/27/23 1132 0     Pain Loc --      Pain Education --      Exclude from Growth Chart --    No data found.  Updated Vital Signs BP 135/89 (BP Location: Left Arm)   Pulse 80   Temp 97.7 F (36.5 C) (Oral)   Ht 5\' 1"  (1.549 m)   Wt 90.7 kg   SpO2 97%   BMI 37.79 kg/m   Visual Acuity Right Eye Distance:   Left Eye Distance:   Bilateral Distance:    Right Eye Near:   Left Eye Near:    Bilateral Near:     Physical Exam  GEN: alert, well appearing female, in no acute distress  CV: regular rate , strong radial pulse  RESP: no increased work of breathing MSK: Left  Hand: Inspection: No  obvious deformity b/l. No swelling, erythema or bruising, left index finger laceration  Palpation: no TTP b/l ROM: Full ROM of the digits and wrist, No swelling in PIP, DIP joints b/l. Flexor digitorum profundus and superficialis tendon functions are intact.  PIP joint collateral ligaments are stable  Strength: 5/5 strength in the forearm, wrist and interosseus muscles b/l Neurovascular: NV intact b/l NEURO: alert, moves all extremities appropriately SKIN: warm and dry; 2.5 cm curved laceration with flap         UC Treatments / Results  Labs (all labs ordered are listed, but only abnormal results are displayed) Labs Reviewed - No data to display  EKG   Radiology No results found.  Procedures Laceration Repair  Date/Time: 05/27/2023 12:36 PM  Performed by: Fidel Huddle, DO Authorized by: Cruzito Standre, DO   Consent:    Consent obtained:  Verbal   Consent given by:  Patient   Risks, benefits, and alternatives were discussed: yes   Universal protocol:    Patient identity confirmed:  Verbally with patient Anesthesia:    Anesthesia method:  Local infiltration   Local anesthetic:  Lidocaine 1% w/o epi Laceration details:    Location:  Finger   Finger location:  L index finger   Length (cm):  2.5 Pre-procedure details:    Preparation:  Patient was prepped and draped in usual sterile fashion Exploration:    Hemostasis achieved with:  Direct pressure Treatment:    Area cleansed with:  Shur-Clens and saline   Amount of cleaning:  Standard   Irrigation solution:  Sterile saline Skin repair:    Repair method:  Sutures   Suture size:  4-0   Suture material:  Prolene   Suture technique:  Simple interrupted Approximation:    Approximation:  Close Repair type:    Repair type:  Simple Post-procedure details:    Dressing:  Splint for protection and tube gauze   Procedure completion:  Tolerated  (including critical care time)  Medications Ordered in UC Medications   Tdap (BOOSTRIX) injection 0.5 mL (0.5 mLs Intramuscular Given 05/27/23 1155)    Initial Impression / Assessment and Plan / UC Course  I have reviewed the triage vital signs and the nursing notes.  Pertinent labs & imaging results that were available during my care of  the patient were reviewed by me and considered in my medical decision making (see chart for details).     Pt is a 68 y.o. female who presents after injuring left index finger at home just prior to arrival. Curved laceration was repaired patient tolerated procedure well.  Pt received 3 sutures.  Home care instructions provided. OTC analgesics as needed for pain.  Tetanus updated prior to discharge. Antibiotics as below for prophylaxis.  Tylenol and/or Motrin as needed for pain.    Final Clinical Impressions(s) / UC Diagnoses   Final diagnoses:  Laceration of left index finger without damage to nail, foreign body presence unspecified, initial encounter     Discharge Instructions      Stop by the pharmacy to pick up your prescriptions.  Follow up with your primary care provider or return to urgent care to have sutures removed in 10-14 days.  Watch for signs and symptoms of infection like fever worsening redness or increasing pain. See handout for more information.       ED Prescriptions     Medication Sig Dispense Auth. Provider   doxycycline  (VIBRAMYCIN ) 100 MG capsule Take 1 capsule (100 mg total) by mouth 2 (two) times daily. 10 capsule Starling Christofferson, DO      PDMP not reviewed this encounter.              Jet Armbrust, DO 05/27/23 1237

## 2023-05-27 NOTE — ED Triage Notes (Signed)
 Pt c/o injury to left 2nd finger.  Pt states that she was putting her dog in the crate and she caught her finger on the crate and it peeled the skin back on her finger.   Pt states that she needs a tetanus.

## 2023-06-10 ENCOUNTER — Ambulatory Visit: Admission: EM | Admit: 2023-06-10 | Discharge: 2023-06-10 | Disposition: A | Discharge status: Home / Self Care

## 2023-06-10 DIAGNOSIS — Z4802 Encounter for removal of sutures: Secondary | ICD-10-CM | POA: Diagnosis not present

## 2023-06-10 NOTE — ED Triage Notes (Signed)
 Pt presents for suture removal.  Pt had 3 sutures placed on 4.18.25.  Pt voiced no concerns or questions during suture removal.  2 sutures removed from patient left index finger.  Pt states that 1 suture fell out and she believes that it fell out after hitting her finger at the grocery store. Pt voiced no concerns over missing suture and stated that she is not worried about it being not present.  Pt denies any pain or discomfort at suture site.

## 2024-01-15 LAB — COLOGUARD: COLOGUARD: NEGATIVE

## 2024-03-15 ENCOUNTER — Other Ambulatory Visit: Payer: Self-pay

## 2024-03-15 DIAGNOSIS — Z780 Asymptomatic menopausal state: Secondary | ICD-10-CM

## 2024-03-15 DIAGNOSIS — Z1231 Encounter for screening mammogram for malignant neoplasm of breast: Secondary | ICD-10-CM
